# Patient Record
Sex: Female | Born: 1960 | Race: Black or African American | Hispanic: No | Marital: Single | Smoking: Current every day smoker
Health system: Southern US, Community
[De-identification: ages and names within clinical notes are randomized; demographics above are authoritative.]

## PROBLEM LIST (undated history)

## (undated) ENCOUNTER — Emergency Department: Admission: EM | Payer: Medicaid Other | Source: Home / Self Care

## (undated) DIAGNOSIS — G43909 Migraine, unspecified, not intractable, without status migrainosus: Secondary | ICD-10-CM

## (undated) DIAGNOSIS — E785 Hyperlipidemia, unspecified: Secondary | ICD-10-CM

## (undated) DIAGNOSIS — I10 Essential (primary) hypertension: Secondary | ICD-10-CM

## (undated) DIAGNOSIS — J309 Allergic rhinitis, unspecified: Secondary | ICD-10-CM

## (undated) HISTORY — DX: Migraine, unspecified, not intractable, without status migrainosus: G43.909

## (undated) HISTORY — PX: ABDOMINAL HYSTERECTOMY: SHX81

## (undated) HISTORY — DX: Allergic rhinitis, unspecified: J30.9

---

## 2006-02-07 ENCOUNTER — Ambulatory Visit: Payer: Self-pay | Admitting: Family Medicine

## 2006-03-04 ENCOUNTER — Ambulatory Visit: Payer: Self-pay | Admitting: Family Medicine

## 2006-04-08 DIAGNOSIS — G43909 Migraine, unspecified, not intractable, without status migrainosus: Secondary | ICD-10-CM | POA: Insufficient documentation

## 2006-10-24 ENCOUNTER — Ambulatory Visit: Payer: Self-pay | Admitting: Family Medicine

## 2006-12-12 ENCOUNTER — Ambulatory Visit: Payer: Self-pay | Admitting: Family Medicine

## 2006-12-19 ENCOUNTER — Ambulatory Visit: Payer: Self-pay | Admitting: Family Medicine

## 2006-12-19 LAB — CONVERTED CEMR LAB
Albumin: 4.1 g/dL (ref 3.5–5.2)
Alkaline Phosphatase: 89 units/L (ref 39–117)
Basophils Absolute: 0 10*3/uL (ref 0.0–0.1)
Basophils Relative: 0 % (ref 0–1)
Beta hcg, urine, semiquantitative: NEGATIVE
Calcium: 9 mg/dL (ref 8.4–10.5)
Cholesterol: 238 mg/dL — ABNORMAL HIGH (ref 0–200)
Eosinophils Absolute: 0 10*3/uL (ref 0.0–0.7)
Eosinophils Relative: 1 % (ref 0–5)
HCT: 42.6 % (ref 36.0–46.0)
HDL: 46 mg/dL (ref >39)
Monocytes Absolute: 0.3 10*3/uL (ref 0.2–0.7)
Potassium: 3.6 meq/L (ref 3.5–5.3)
RDW: 13.8 % (ref 11.5–14.0)
Sodium: 148 meq/L — ABNORMAL HIGH (ref 135–145)
TSH: 0.011 microintl units/mL — ABNORMAL LOW (ref 0.350–5.50)
Total CHOL/HDL Ratio: 5.2
Total Protein: 7.1 g/dL (ref 6.0–8.3)
Triglycerides: 293 mg/dL — ABNORMAL HIGH (ref <150)

## 2006-12-25 ENCOUNTER — Encounter: Payer: Self-pay | Admitting: Family Medicine

## 2006-12-25 ENCOUNTER — Telehealth (INDEPENDENT_AMBULATORY_CARE_PROVIDER_SITE_OTHER): Payer: Self-pay | Admitting: *Deleted

## 2006-12-26 ENCOUNTER — Telehealth: Payer: Self-pay | Admitting: Family Medicine

## 2006-12-26 ENCOUNTER — Encounter: Admission: RE | Admit: 2006-12-26 | Discharge: 2006-12-26 | Payer: Self-pay | Admitting: Family Medicine

## 2006-12-26 ENCOUNTER — Ambulatory Visit: Payer: Self-pay | Admitting: Family Medicine

## 2006-12-26 DIAGNOSIS — R109 Unspecified abdominal pain: Secondary | ICD-10-CM | POA: Insufficient documentation

## 2006-12-31 ENCOUNTER — Telehealth: Payer: Self-pay | Admitting: Family Medicine

## 2006-12-31 DIAGNOSIS — N9489 Other specified conditions associated with female genital organs and menstrual cycle: Secondary | ICD-10-CM | POA: Insufficient documentation

## 2007-01-05 ENCOUNTER — Ambulatory Visit: Payer: Self-pay | Admitting: Family Medicine

## 2007-01-05 DIAGNOSIS — R030 Elevated blood-pressure reading, without diagnosis of hypertension: Secondary | ICD-10-CM | POA: Insufficient documentation

## 2007-01-12 ENCOUNTER — Encounter: Payer: Self-pay | Admitting: Family Medicine

## 2007-01-14 ENCOUNTER — Telehealth (INDEPENDENT_AMBULATORY_CARE_PROVIDER_SITE_OTHER): Payer: Self-pay | Admitting: *Deleted

## 2007-01-19 ENCOUNTER — Encounter: Payer: Self-pay | Admitting: Family Medicine

## 2007-01-19 LAB — CONVERTED CEMR LAB
Alkaline Phosphatase: 80 units/L
BUN: 8 mg/dL
Calcium: 9.4 mg/dL
Creatinine, Ser: 0.9 mg/dL
Potassium: 3.4 meq/L
Sodium: 146 meq/L

## 2007-01-22 ENCOUNTER — Encounter: Payer: Self-pay | Admitting: Family Medicine

## 2007-02-19 ENCOUNTER — Encounter: Payer: Self-pay | Admitting: Family Medicine

## 2007-12-29 ENCOUNTER — Ambulatory Visit: Payer: Self-pay | Admitting: Family Medicine

## 2007-12-29 DIAGNOSIS — L255 Unspecified contact dermatitis due to plants, except food: Secondary | ICD-10-CM | POA: Insufficient documentation

## 2008-01-20 ENCOUNTER — Encounter: Payer: Self-pay | Admitting: Family Medicine

## 2008-01-20 ENCOUNTER — Ambulatory Visit: Payer: Self-pay | Admitting: Family Medicine

## 2008-01-20 ENCOUNTER — Other Ambulatory Visit: Admission: RE | Admit: 2008-01-20 | Discharge: 2008-01-20 | Payer: Self-pay | Admitting: Family Medicine

## 2008-01-20 DIAGNOSIS — N951 Menopausal and female climacteric states: Secondary | ICD-10-CM | POA: Insufficient documentation

## 2008-01-21 ENCOUNTER — Telehealth (INDEPENDENT_AMBULATORY_CARE_PROVIDER_SITE_OTHER): Payer: Self-pay | Admitting: *Deleted

## 2008-01-21 ENCOUNTER — Encounter: Payer: Self-pay | Admitting: Family Medicine

## 2008-01-21 LAB — CONVERTED CEMR LAB: Direct LDL: 93 mg/dL

## 2008-01-22 LAB — CONVERTED CEMR LAB
ALT: 13 units/L (ref 0–35)
AST: 19 units/L (ref 0–37)
BUN: 9 mg/dL (ref 6–23)
Calcium: 9 mg/dL (ref 8.4–10.5)
Creatinine, Ser: 0.82 mg/dL (ref 0.40–1.20)
Glucose, Bld: 92 mg/dL (ref 70–99)
Potassium: 3.8 meq/L (ref 3.5–5.3)
Sodium: 147 meq/L — ABNORMAL HIGH (ref 135–145)
Total CHOL/HDL Ratio: 5.8

## 2008-01-25 ENCOUNTER — Encounter: Admission: RE | Admit: 2008-01-25 | Discharge: 2008-01-25 | Payer: Self-pay | Admitting: Family Medicine

## 2008-03-30 ENCOUNTER — Ambulatory Visit: Payer: Self-pay | Admitting: Family Medicine

## 2008-04-11 ENCOUNTER — Ambulatory Visit: Payer: Self-pay | Admitting: Family Medicine

## 2008-06-15 ENCOUNTER — Ambulatory Visit: Payer: Self-pay | Admitting: Family Medicine

## 2008-06-15 LAB — CONVERTED CEMR LAB
Bilirubin Urine: NEGATIVE
Glucose, Urine, Semiquant: NEGATIVE
Ketones, urine, test strip: NEGATIVE
Nitrite: NEGATIVE
Specific Gravity, Urine: >=1.03
Urobilinogen, UA: 0.2
pH: 6

## 2008-07-14 ENCOUNTER — Encounter: Admission: RE | Admit: 2008-07-14 | Discharge: 2008-07-14 | Payer: Self-pay | Admitting: Family Medicine

## 2008-07-14 ENCOUNTER — Ambulatory Visit: Payer: Self-pay | Admitting: Family Medicine

## 2008-07-14 DIAGNOSIS — N63 Unspecified lump in unspecified breast: Secondary | ICD-10-CM | POA: Insufficient documentation

## 2008-12-16 ENCOUNTER — Ambulatory Visit: Payer: Self-pay | Admitting: Family Medicine

## 2009-01-30 ENCOUNTER — Encounter: Admission: RE | Admit: 2009-01-30 | Discharge: 2009-01-30 | Payer: Self-pay | Admitting: Family Medicine

## 2009-02-15 ENCOUNTER — Encounter: Admission: RE | Admit: 2009-02-15 | Discharge: 2009-02-15 | Payer: Self-pay | Admitting: Family Medicine

## 2009-02-15 ENCOUNTER — Ambulatory Visit: Payer: Self-pay | Admitting: Family Medicine

## 2009-02-21 ENCOUNTER — Ambulatory Visit: Payer: Self-pay | Admitting: Family Medicine

## 2009-05-03 ENCOUNTER — Ambulatory Visit: Payer: Self-pay | Admitting: Family Medicine

## 2009-05-03 DIAGNOSIS — G5600 Carpal tunnel syndrome, unspecified upper limb: Secondary | ICD-10-CM | POA: Insufficient documentation

## 2009-05-03 DIAGNOSIS — K5289 Other specified noninfective gastroenteritis and colitis: Secondary | ICD-10-CM | POA: Insufficient documentation

## 2009-05-04 LAB — CONVERTED CEMR LAB
ALT: 10 units/L (ref 0–35)
AST: 16 units/L (ref 0–37)
Alkaline Phosphatase: 120 units/L — ABNORMAL HIGH (ref 39–117)
BUN: 9 mg/dL (ref 6–23)
Eosinophils Relative: 1 % (ref 0–5)
Hemoglobin: 14.1 g/dL (ref 12.0–15.0)
Lymphs Abs: 3.1 10*3/uL (ref 0.7–4.0)
MCHC: 33 g/dL (ref 30.0–36.0)
Monocytes Relative: 4 % (ref 3–12)
Neutrophils Relative %: 55 % (ref 43–77)
Platelets: 233 10*3/uL (ref 150–400)
Potassium: 3.8 meq/L (ref 3.5–5.3)
Total Bilirubin: 0.3 mg/dL (ref 0.3–1.2)
WBC: 7.8 10*3/uL (ref 4.0–10.5)

## 2009-05-09 ENCOUNTER — Encounter: Payer: Self-pay | Admitting: Family Medicine

## 2009-05-12 ENCOUNTER — Ambulatory Visit: Payer: Self-pay | Admitting: Family Medicine

## 2009-11-09 ENCOUNTER — Ambulatory Visit: Payer: Self-pay | Admitting: Family Medicine

## 2010-01-05 ENCOUNTER — Ambulatory Visit: Payer: Self-pay | Admitting: Family Medicine

## 2010-01-05 DIAGNOSIS — B369 Superficial mycosis, unspecified: Secondary | ICD-10-CM | POA: Insufficient documentation

## 2010-01-06 ENCOUNTER — Encounter: Payer: Self-pay | Admitting: Family Medicine

## 2010-01-08 ENCOUNTER — Telehealth (INDEPENDENT_AMBULATORY_CARE_PROVIDER_SITE_OTHER): Payer: Self-pay | Admitting: *Deleted

## 2010-02-02 ENCOUNTER — Ambulatory Visit: Payer: Self-pay | Admitting: Family Medicine

## 2010-02-02 DIAGNOSIS — J069 Acute upper respiratory infection, unspecified: Secondary | ICD-10-CM | POA: Insufficient documentation

## 2010-02-13 ENCOUNTER — Encounter: Admission: RE | Admit: 2010-02-13 | Discharge: 2010-02-13 | Payer: Self-pay | Admitting: Family Medicine

## 2010-02-13 LAB — HM MAMMOGRAPHY: HM Mammogram: NEGATIVE

## 2010-04-11 ENCOUNTER — Ambulatory Visit: Payer: Self-pay | Admitting: Family Medicine

## 2010-04-20 ENCOUNTER — Ambulatory Visit: Payer: Self-pay | Admitting: Family Medicine

## 2010-05-29 ENCOUNTER — Ambulatory Visit: Payer: Self-pay | Admitting: Family Medicine

## 2010-06-12 ENCOUNTER — Telehealth: Payer: Self-pay | Admitting: Family Medicine

## 2010-07-24 ENCOUNTER — Encounter
Admission: RE | Admit: 2010-07-24 | Discharge: 2010-07-24 | Payer: Self-pay | Source: Home / Self Care | Attending: Family Medicine | Admitting: Family Medicine

## 2010-07-24 ENCOUNTER — Ambulatory Visit
Admission: RE | Admit: 2010-07-24 | Discharge: 2010-07-24 | Payer: Self-pay | Source: Home / Self Care | Attending: Family Medicine | Admitting: Family Medicine

## 2010-07-24 DIAGNOSIS — M79609 Pain in unspecified limb: Secondary | ICD-10-CM | POA: Insufficient documentation

## 2010-07-31 NOTE — Letter (Signed)
Summary: Out of Work  [facility]  [address]   [place], [place] [address]   Phone: [phone]  Fax: [phone]    April 11, 2010   Employee:  [name]    To Whom It May Concern:   For Medical reasons, please excuse the above named employee from work for the following dates:  Start:   Oct 12th -13th  End:   Oct 14th  If you need additional information, please feel free to contact our office.         Sincerely,    Seymour Bars DO

## 2010-07-31 NOTE — Assessment & Plan Note (Signed)
Summary: rash/ migraines   Vital Signs:  Patient profile:   50 year old female Height:      66.5 inches Weight:      208 pounds BMI:     33.19 O2 Sat:      99 % on Room air Pulse rate:   81 / minute BP sitting:   125 / 79  (left arm) Cuff size:   large  Vitals Entered By: Payton Spark CMA (January 05, 2010 11:15 AM)  O2 Flow:  Room air CC: Rash on both arms x 2 weeks. Also needs all meds refilled   Primary Care Provider:  Seymour Bars D.O.  CC:  Rash on both arms x 2 weeks. Also needs all meds refilled.  History of Present Illness: 50 yo AAF presents for a rash on her L arm (only ) x 2 wks.  It is not itchy or painful.  Denies any known exposures or contact with ringworm, animals, soil, etc.  O/W feeling well.  Not using anything to treat this.  Not spreading.   Denies any new meds.  Having more frequent migraines.  Using Imitrex 3 x a wk this summer.  Current Medications (verified): 1)  Zyrtec-D  Tb12 (Cetirizine-Pseudoephedrine Tb12) .Marland Kitchen.. 1 Tab By Mouth Two Times A Day Prn 2)  Flonase 50 Mcg/act Susp (Fluticasone Propionate) .... 2 Sprays Per Nostril Once Daily 3)  Imitrex 100 Mg Tabs (Sumatriptan Succinate) .Marland Kitchen.. 1 Tab By Mouth X 1 As Needed Migraine 4)  Ambien 10 Mg Tabs (Zolpidem Tartrate) .Marland Kitchen.. 1 Tab By Mouth At Bedtime As Needed Sleep 5)  Hydrochlorothiazide 12.5 Mg  Tabs (Hydrochlorothiazide) .Marland Kitchen.. 1 Tab By Mouth Daily 6)  Proair Hfa 108 (90 Base) Mcg/act Aers (Albuterol Sulfate) .... 2 Puffs 4 X A Day For 1 Wk 7)  Promethazine Hcl 25 Mg Tabs (Promethazine Hcl) .Marland Kitchen.. 1 Tab By Mouth Q 6 Hrs As Needed Nausea 8)  Zovirax 5 % Crea (Acyclovir) .... Apply To Cold Sores 5 X A Day For 4 Days  Allergies (verified): No Known Drug Allergies  Past History:  Past Medical History: Reviewed history from 10/24/2006 and no changes required. AR migraines  Social History: Reviewed history from 01/20/2008 and no changes required. 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  Sexually active w/ boyfriend.    Review of Systems      See HPI  Physical Exam  General:  alert, well-developed, well-nourished, and well-hydrated.   Mouth:  pharynx pink and moist.   Neck:  no masses.   Lungs:  Normal respiratory effort, chest expands symmetrically. Lungs are clear to auscultation, no crackles or wheezes. Heart:  Normal rate and regular rhythm. S1 and S2 normal without gallop, murmur, click, rub or other extra sounds. Skin:  Raised border annular lesion L upper arm with central clearing.  Erythematous tiny blistering lesions above and below this.  No visible insect puncture site.  No scaling. Psych:  good eye contact, not anxious appearing, and not depressed appearing.     Impression & Recommendations:  Problem # 1:  FUNGAL DERMATITIS (ICD-111.9) Obtained KOH from L arm rash.  Likely tinea corporis.  Empirically treat with Ketoconazole cream.  DDX includes cutaneous larva migrans.   Her updated medication list for this problem includes:    Ketoconazole 2 % Crea (Ketoconazole) .Marland Kitchen... Apply to rash two times a day x 2 wks  Orders: T-KOH Prep  Fungal ([id number])  Problem # 2:  MIGRAINE, UNSPEC., W/O INTRACTABLE MIGRAINE (ICD-346.90) More frequent migraines.  Add daily prophylactic agent - Atenolol once a day.  Change Imitrex to Naratriptan due to longer half life.  Call if any problems. Her updated medication list for this problem includes:    Naratriptan Hcl 2.5 Mg Tabs (Naratriptan hcl) .Marland Kitchen... 1 tab by mouth x 1; repeat in 4 hrs if needed    Atenolol 25 Mg Tabs (Atenolol) .Marland Kitchen... 1 tab by mouth daily  Complete Medication List: 1)  Zyrtec-d Tb12 (Cetirizine-pseudoephedrine tb12) .Marland Kitchen.. 1 tab by mouth two times a day prn 2)  Flonase 50 Mcg/act Susp (Fluticasone propionate) .... 2 sprays per nostril once daily 3)  Naratriptan Hcl 2.5 Mg Tabs (Naratriptan hcl) .Marland Kitchen.. 1 tab by mouth x 1; repeat in 4 hrs  if needed 4)  Ambien 10 Mg Tabs (Zolpidem tartrate) .Marland Kitchen.. 1 tab by mouth at bedtime as needed sleep 5)  Hydrochlorothiazide 12.5 Mg Tabs (Hydrochlorothiazide) .Marland Kitchen.. 1 tab by mouth daily 6)  Proair Hfa 108 (90 Base) Mcg/act Aers (Albuterol sulfate) .... 2 puffs 4 x a day  prn 7)  Promethazine Hcl 25 Mg Tabs (Promethazine hcl) .Marland Kitchen.. 1 tab by mouth q 6 hrs as needed nausea 8)  Zovirax 5 % Crea (Acyclovir) .... Apply to cold sores 5 x a day for 4 days 9)  Atenolol 25 Mg Tabs (Atenolol) .Marland Kitchen.. 1 tab by mouth daily 10)  Ketoconazole 2 % Crea (Ketoconazole) .... Apply to rash two times a day x 2 wks  Patient Instructions: 1)  Will call you with fungal prep on Monday. 2)  start on Atenolol daily for migraine prevention and change imitrex to naratriptan.   Prescriptions: KETOCONAZOLE 2 % CREA (KETOCONAZOLE) apply to rash two times a day x 2 wks  #1 tube x 0   Entered and Authorized by:   Seymour Bars DO   Signed by:   Seymour Bars DO on 01/05/2010   Method used:   Electronically to        [facility] [phone]* (retail)       [address]       [place], [place]  [address]       Ph: [phone]       Fax: [phone]   RxID:   [id number] NARATRIPTAN HCL 2.5 MG TABS (NARATRIPTAN HCL) 1 tab by mouth x 1; repeat in 4 hrs if needed  #12 x 1   Entered and Authorized by:   Seymour Bars DO   Signed by:   Seymour Bars DO on 01/05/2010   Method used:   Electronically to        [facility] [phone]* (retail)       [address]       [place], [place]  [address]       Ph: [phone]       Fax: [phone]   RxID:   [id number] PROAIR HFA 108 (90 BASE) MCG/ACT AERS (ALBUTEROL SULFATE) 2 puffs 4 x a day  prn  #1 x 1   Entered and Authorized by:   Seymour Bars DO   Signed by:   Seymour Bars DO on 01/05/2010   Method used:   Electronically to        [facility] [phone]* (retail)       [address]       [place], [place]  [address]       Ph: [phone]  Fax: [phone]   RxID:    [id number] ATENOLOL 25 MG TABS (ATENOLOL) 1 tab by mouth daily  #30 x 2   Entered and Authorized by:   Seymour Bars DO   Signed by:   Seymour Bars DO on 01/05/2010   Method used:   Electronically to        [facility] [phone]* (retail)       [address]       [place], [place]  [address]       Ph: [phone]       Fax: [phone]   RxID:   [id number]

## 2010-07-31 NOTE — Assessment & Plan Note (Signed)
Summary: FLU-SHOT  Nurse Visit  Flu Vaccine Consent Questions     Do you have a history of severe allergic reactions to this vaccine? no    Any prior history of allergic reactions to egg and/or gelatin? no    Do you have a sensitivity to the preservative Thimersol? no    Do you have a past history of Guillan-Barre Syndrome? no    Do you currently have an acute febrile illness? no    Have you ever had a severe reaction to latex? no    Vaccine information given and explained to patient? yes    Are you currently pregnant? no    Lot Number:[id number]   Exp Date:12/29/2010   Site Given  Left Deltoid IM  Allergies: No Known Drug Allergies  Orders Added: 1)  Admin 1st Vaccine [[address]] 2)  Flu Vaccine 34yrs + [[address]]

## 2010-07-31 NOTE — Assessment & Plan Note (Signed)
Summary: URI   Vital Signs:  Patient profile:   50 year old female Height:      66.5 inches Weight:      207 pounds BMI:     33.03 O2 Sat:      98 % on Room air Temp:     98.5 degrees F oral Pulse rate:   92 / minute BP sitting:   143 / 81  (left arm) Cuff size:   large  Vitals Entered By: Payton Spark CMA (February 02, 2010 1:05 PM)  O2 Flow:  Room air CC: Chest congestion and cough x 1 week.   Primary Care Provider:  Seymour Bars D.O.  CC:  Chest congestion and cough x 1 week.Marland Kitchen  History of Present Illness: 50 yo AAF presents for cough and head/ chest congestion x 5 days.  Her cough has become productive of green phlegm.  She works at a daycare.  She has had some chest tightness and SOB.  She is taking theraflu which helps some.  She coughs a lot at night.  Low grade fevers.  Feels really tired.    She has a hx of allergies but no asthma or smoking hx. Denies any GI upset.      Current Medications (verified): 1)  Zyrtec-D  Tb12 (Cetirizine-Pseudoephedrine Tb12) .Marland Kitchen.. 1 Tab By Mouth Two Times A Day Prn 2)  Flonase 50 Mcg/act Susp (Fluticasone Propionate) .... 2 Sprays Per Nostril Once Daily 3)  Naratriptan Hcl 2.5 Mg Tabs (Naratriptan Hcl) .Marland Kitchen.. 1 Tab By Mouth X 1; Repeat in 4 Hrs If Needed 4)  Ambien 10 Mg Tabs (Zolpidem Tartrate) .Marland Kitchen.. 1 Tab By Mouth At Bedtime As Needed Sleep 5)  Hydrochlorothiazide 12.5 Mg  Tabs (Hydrochlorothiazide) .Marland Kitchen.. 1 Tab By Mouth Daily 6)  Proair Hfa 108 (90 Base) Mcg/act Aers (Albuterol Sulfate) .... 2 Puffs 4 X A Day  Prn 7)  Promethazine Hcl 25 Mg Tabs (Promethazine Hcl) .Marland Kitchen.. 1 Tab By Mouth Q 6 Hrs As Needed Nausea 8)  Zovirax 5 % Crea (Acyclovir) .... Apply To Cold Sores 5 X A Day For 4 Days 9)  Atenolol 25 Mg Tabs (Atenolol) .Marland Kitchen.. 1 Tab By Mouth Daily 10)  Ketoconazole 2 % Crea (Ketoconazole) .... Apply To Rash Two Times A Day X 2 Wks  Allergies (verified): No Known Drug Allergies  Past History:  Past Medical History: Reviewed history  from 10/24/2006 and no changes required. AR migraines  Social History: Reviewed history from 01/20/2008 and no changes required. 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  Sexually active w/ boyfriend.    Review of Systems      See HPI  Physical Exam  General:  alert, well-developed, well-nourished, well-hydrated, and overweight-appearing.  alert, well-developed, well-nourished, and well-hydrated.   Head:  normocephalic and atraumatic.  sinuses NTTPnormocephalic and atraumatic.   Eyes:  conjunctiva clear Ears:  EACs patent; TMs translucent and gray with good cone of light and bony landmarks.  Nose:  clear rhinorrhea with boggy turbinates Mouth:  o/p pink and moist Neck:  no masses.  no masses.   Chest Wall:  no tenderness.  no tenderness.   Lungs:  Normal respiratory effort, chest expands symmetrically. Lungs are clear to auscultation, no crackles or wheezes. dry cough Heart:  Normal rate and regular rhythm. S1 and S2 normal without gallop, murmur, click, rub or other extra sounds. Skin:  color normal.  papular  rash on upper armscolor normal.   Cervical Nodes:  No lymphadenopathy noted   Impression & Recommendations:  Problem # 1:  VIRAL URI (ICD-465.9)  Her updated medication list for this problem includes:    Zyrtec-d Tb12 (Cetirizine-pseudoephedrine tb12) .Marland Kitchen... 1 tab by mouth two times a day prn    Promethazine Hcl 25 Mg Tabs (Promethazine hcl) .Marland Kitchen... 1 tab by mouth q 6 hrs as needed nausea    Promethazine Vc/codeine 6.25-5-10 Mg/88ml Syrp (Phenyleph-promethazine-cod) .Marland KitchenMarland KitchenMarland KitchenMarland Kitchen 5 ml by mouth q 6 hrs as needed cough/ congestion  Instructed on symptomatic treatment. Call if symptoms persist or worsen.   Complete Medication List: 1)  Zyrtec-d Tb12 (Cetirizine-pseudoephedrine tb12) .Marland Kitchen.. 1 tab by mouth two times a day prn 2)  Flonase 50 Mcg/act Susp (Fluticasone propionate) .... 2 sprays per nostril  once daily 3)  Naratriptan Hcl 2.5 Mg Tabs (Naratriptan hcl) .Marland Kitchen.. 1 tab by mouth x 1; repeat in 4 hrs if needed 4)  Ambien 10 Mg Tabs (Zolpidem tartrate) .Marland Kitchen.. 1 tab by mouth at bedtime as needed sleep 5)  Hydrochlorothiazide 12.5 Mg Tabs (Hydrochlorothiazide) .Marland Kitchen.. 1 tab by mouth daily 6)  Proair Hfa 108 (90 Base) Mcg/act Aers (Albuterol sulfate) .... 2 puffs 4 x a day  prn 7)  Promethazine Hcl 25 Mg Tabs (Promethazine hcl) .Marland Kitchen.. 1 tab by mouth q 6 hrs as needed nausea 8)  Zovirax 5 % Crea (Acyclovir) .... Apply to cold sores 5 x a day for 4 days 9)  Atenolol 25 Mg Tabs (Atenolol) .Marland Kitchen.. 1 tab by mouth daily 10)  Ketoconazole 2 % Crea (Ketoconazole) .... Apply to rash two times a day x 2 wks 11)  Promethazine Vc/codeine 6.25-5-10 Mg/39ml Syrp (Phenyleph-promethazine-cod) .... 5 ml by mouth q 6 hrs as needed cough/ congestion  Patient Instructions: 1)  Use Rx cough syrup as needed for cough and congestion. 2)  It may make you drowsy. 3)  Call if not improved by next WED. Prescriptions: PROMETHAZINE VC/CODEINE 6.25-5-10 MG/5ML SYRP (PHENYLEPH-PROMETHAZINE-COD) 5 ml by mouth q 6 hrs as needed cough/ congestion  #200 ml x 0   Entered and Authorized by:   Seymour Bars DO   Signed by:   Seymour Bars DO on 02/02/2010   Method used:   Printed then faxed to ...       [facility] [phone]* (retail)       [address]       [place], [place]  [address]       Ph: [phone]       Fax: [phone]   RxID:   [id number]

## 2010-07-31 NOTE — Assessment & Plan Note (Signed)
Summary: gastroenteritis   Vital Signs:  Patient profile:   50 year old female Height:      66.5 inches Weight:      207 pounds BMI:     33.03 O2 Sat:      98 % on Room air Temp:     97.9 degrees F oral Pulse rate:   82 / minute BP sitting:   142 / 94  (left arm) Cuff size:   large  Vitals Entered By: Payton Spark CMA (April 11, 2010 9:49 AM)  O2 Flow:  Room air CC: Nausea, achy, diarrhea, and chills x 5 days.   Primary Care Libia Fazzini:  Seymour Bars D.O.  CC:  Nausea, achy, diarrhea, and and chills x 5 days.Marland Kitchen  History of Present Illness: 50 yo AAF presents for not feeling well x 5 days.  Started with fatigue.  Having N/V/D.  She was sent home from work yesterday.  She has been taking Promethazine which helped some.  She is having 3 loose stools / day.  Having anorexia, bloating and cramping.  No blood in her stool.  No fevers or chills.  She works at [employer].  Recent travel was to [place].    Current Medications (verified): 1)  Zyrtec-D  Tb12 (Cetirizine-Pseudoephedrine Tb12) .Marland Kitchen.. 1 Tab By Mouth Two Times A Day Prn 2)  Flonase 50 Mcg/act Susp (Fluticasone Propionate) .... 2 Sprays Per Nostril Once Daily 3)  Naratriptan Hcl 2.5 Mg Tabs (Naratriptan Hcl) .Marland Kitchen.. 1 Tab By Mouth X 1; Repeat in 4 Hrs If Needed 4)  Ambien 10 Mg Tabs (Zolpidem Tartrate) .Marland Kitchen.. 1 Tab By Mouth At Bedtime As Needed Sleep 5)  Hydrochlorothiazide 12.5 Mg  Tabs (Hydrochlorothiazide) .Marland Kitchen.. 1 Tab By Mouth Daily 6)  Proair Hfa 108 (90 Base) Mcg/act Aers (Albuterol Sulfate) .... 2 Puffs 4 X A Day  Prn 7)  Promethazine Hcl 25 Mg Tabs (Promethazine Hcl) .Marland Kitchen.. 1 Tab By Mouth Q 6 Hrs As Needed Nausea 8)  Zovirax 5 % Crea (Acyclovir) .... Apply To Cold Sores 5 X A Day For 4 Days 9)  Atenolol 25 Mg Tabs (Atenolol) .Marland Kitchen.. 1 Tab By Mouth Daily 10)  Ketoconazole 2 % Crea (Ketoconazole) .... Apply To Rash Two Times A Day X 2 Wks  Allergies (verified): No Known Drug Allergies  Past History:  Past Medical  History: Reviewed history from 10/24/2006 and no changes required. AR migraines  Past Surgical History: Reviewed history from 01/20/2008 and no changes required. 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  Sexually active w/ boyfriend.    Review of Systems      See HPI  Physical Exam  General:  alert, well-developed, well-nourished, well-hydrated, and overweight-appearing.   Head:  normocephalic and atraumatic.   Eyes:  sclera non icteric Mouth:  pharynx pink and moist.   Neck:  no masses.   Lungs:  Normal respiratory effort, chest expands symmetrically. Lungs are clear to auscultation, no crackles or wheezes. Heart:  Normal rate and regular rhythm. S1 and S2 normal without gallop, murmur, click, rub or other extra sounds. Abdomen:  R and L LQ TTP but no R/G/R, mildly distended Extremities:  no LE edema Skin:  color normal.  no jaundice or pallor Cervical Nodes:  No lymphadenopathy noted   Impression & Recommendations:  Problem #  1:  GASTROENTERITIS WITHOUT DEHYDRATION (ICD-558.9) Day 5 viral gastroenteritis, improving.  No sign of clinical dehydration. Treat with Immodium and Zofran.  Clear fluids with advancement to [employer] tomorrow.  Out of work note given for today and tomorrow. Call if not completely resolved by Monday. Her updated medication list for this problem includes:    Zofran Odt 8 Mg Tbdp (Ondansetron) .Marland Kitchen... 1 tab by mouth q 8 hrs as needed nausea  Complete Medication List: 1)  Zyrtec-d Tb12 (Cetirizine-pseudoephedrine tb12) .Marland Kitchen.. 1 tab by mouth two times a day prn 2)  Flonase 50 Mcg/act Susp (Fluticasone propionate) .... 2 sprays per nostril once daily 3)  Naratriptan Hcl 2.5 Mg Tabs (Naratriptan hcl) .Marland Kitchen.. 1 tab by mouth x 1; repeat in 4 hrs if  needed 4)  Ambien 10 Mg Tabs (Zolpidem tartrate) .Marland Kitchen.. 1 tab by mouth at bedtime as needed sleep 5)  Hydrochlorothiazide 12.5 Mg Tabs (Hydrochlorothiazide) .Marland Kitchen.. 1 tab by mouth daily 6)  Proair Hfa 108 (90 Base) Mcg/act Aers (Albuterol sulfate) .... 2 puffs 4 x a day  prn 7)  Promethazine Hcl 25 Mg Tabs (Promethazine hcl) .Marland Kitchen.. 1 tab by mouth q 6 hrs as needed nausea 8)  Zovirax 5 % Crea (Acyclovir) .... Apply to cold sores 5 x a day for 4 days 9)  Atenolol 25 Mg Tabs (Atenolol) .Marland Kitchen.. 1 tab by mouth daily 10)  Zofran Odt 8 Mg Tbdp (Ondansetron) .Marland Kitchen.. 1 tab by mouth q 8 hrs as needed nausea  Patient Instructions: 1)  Use Immodium + Zofran as needed for diarrhea and nausea. 2)  Clear fluids today with advancement to Bananas, Rice, Applesause and Toast tomorrow. 3)  Expect improvement in the next 2-3 days. 4)  Call if not completely resolved by MON. Prescriptions: ZOFRAN ODT 8 MG TBDP (ONDANSETRON) 1 tab by mouth q 8 hrs as needed nausea  #15 x 0   Entered and Authorized by:   Seymour Bars DO   Signed by:   Seymour Bars DO on 04/11/2010   Method used:   Electronically to        [facility] [phone]* (retail)       [address]       [place], [place]  [address]       Ph: [phone]       Fax: [phone]   RxID:   [id number]

## 2010-07-31 NOTE — Progress Notes (Signed)
Summary: rash  Phone Note Call from Patient Call back at Home Phone [phone]   Caller: Patient Call For: Seymour Bars DO Details for Reason: Rash Summary of Call: Patient called stating the rash is improved but still has bumps.  She is continuing the ointment and wants to know what Dr Cathey Endow advises at this time. Initial call taken by: Fabienne Bruns,  January 08, 2010 8:26 AM  Follow-up for Phone Call        Continue the Ketoconazole cream thru this wk.  Call if symptoms persist into next wk.   Follow-up by: Seymour Bars DO,  January 08, 2010 10:34 AM  Additional Follow-up for Phone Call Additional follow up Details #1::        [facility] informing Pt of the above Additional Follow-up by: Payton Spark CMA,  January 08, 2010 11:44 AM

## 2010-07-31 NOTE — Assessment & Plan Note (Signed)
Summary: URI   Vital Signs:  Patient profile:   50 year old female Height:      66.5 inches Weight:      206 pounds BMI:     32.87 O2 Sat:      97 % on Room air Temp:     98.5 degrees F oral Pulse rate:   91 / minute BP sitting:   139 / 89  (left arm) Cuff size:   large  Vitals Entered By: Payton Spark CMA (May 29, 2010 8:38 AM)  O2 Flow:  Room air CC: Productive cough and ear pressure x 3 days. OTC meds not helping.    Primary Care Provider:  Seymour Bars D.O.  CC:  Productive cough and ear pressure x 3 days. OTC meds not helping. Wanda Ortiz  History of Present Illness: 50 yo AAF presents for a cough that started 5 days ago.  She tried mucinex and Robitussin but it has not helped.  Denies chest tightness or SOB.  She is a smoker.  Denies F/C.  Some green phlegm.  She is tired but has not had body aches or GI symptoms.  She has had ear pressure, sore throat and runny nose but this is getting better.  Current Medications (verified): 1)  Zyrtec-D  Tb12 (Cetirizine-Pseudoephedrine Tb12) .Wanda Ortiz.. 1 Tab By Mouth Two Times A Day Prn 2)  Flonase 50 Mcg/act Susp (Fluticasone Propionate) .... 2 Sprays Per Nostril Once Daily 3)  Naratriptan Hcl 2.5 Mg Tabs (Naratriptan Hcl) .Wanda Ortiz.. 1 Tab By Mouth X 1; Repeat in 4 Hrs If Needed 4)  Ambien 10 Mg Tabs (Zolpidem Tartrate) .Wanda Ortiz.. 1 Tab By Mouth At Bedtime As Needed Sleep 5)  Hydrochlorothiazide 12.5 Mg  Tabs (Hydrochlorothiazide) .Wanda Ortiz.. 1 Tab By Mouth Daily 6)  Proair Hfa 108 (90 Base) Mcg/act Aers (Albuterol Sulfate) .... 2 Puffs 4 X A Day  Prn 7)  Zovirax 5 % Crea (Acyclovir) .... Apply To Cold Sores 5 X A Day For 4 Days 8)  Atenolol 25 Mg Tabs (Atenolol) .Wanda Ortiz.. 1 Tab By Mouth Daily 9)  Zofran Odt 8 Mg Tbdp (Ondansetron) .Wanda Ortiz.. 1 Tab By Mouth Q 8 Hrs As Needed Nausea  Allergies (verified): No Known Drug Allergies  Past History:  Past Medical History: Reviewed history from 10/24/2006 and no changes required. AR migraines  Social History: Reviewed  history from 01/20/2008 and no changes required. 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  Sexually active w/ boyfriend.    Review of Systems      See HPI  Physical Exam  General:  alert, well-developed, well-nourished, and well-hydrated.  obese Head:  normocephalic and atraumatic.  sinuses NTTP Eyes:  conjunctiva clear Ears:  EACs patent; TMs translucent and gray with good cone of light and bony landmarks.  Nose:  boggy turbinates L>R with nasal congestion; no rhinorrhea Mouth:  o/p injected but no exudates or vesicles Neck:  no masses.   Chest Wall:  no tenderness.   Lungs:  Normal respiratory effort, chest expands symmetrically. Lungs are clear to auscultation, no crackles or wheezes.  dry cough Heart:  Normal rate and regular rhythm. S1 and S2 normal without gallop, murmur, click, rub or other extra sounds. Extremities:  no LE edema Skin:  color normal.   Cervical Nodes:  No lymphadenopathy noted   Impression & Recommendations:  Problem # 1:  VIRAL URI (ICD-465.9) Supportive care for Viral URI.  RX  REzira (hydrocodone - sudafed) samples given (#8- 5ml bottles) to take at night with Mucinex DM + Advil in the AM.  Avoid smoking and call if not resolved in 7 days. The following medications were removed from the medication list:    Promethazine Hcl 25 Mg Tabs (Promethazine hcl) .Wanda Ortiz... 1 tab by mouth q 6 hrs as needed nausea Her updated medication list for this problem includes:    Zyrtec-d Tb12 (Cetirizine-pseudoephedrine tb12) .Wanda Ortiz... 1 tab by mouth two times a day prn  Complete Medication List: 1)  Zyrtec-d Tb12 (Cetirizine-pseudoephedrine tb12) .Wanda Ortiz.. 1 tab by mouth two times a day prn 2)  Flonase 50 Mcg/act Susp (Fluticasone propionate) .... 2 sprays per nostril once daily 3)  Naratriptan Hcl 2.5 Mg Tabs (Naratriptan hcl) .Wanda Ortiz.. 1 tab by mouth x 1; repeat in 4 hrs if needed 4)  Ambien 10 Mg Tabs  (Zolpidem tartrate) .Wanda Ortiz.. 1 tab by mouth at bedtime as needed sleep 5)  Hydrochlorothiazide 12.5 Mg Tabs (Hydrochlorothiazide) .Wanda Ortiz.. 1 tab by mouth daily 6)  Proair Hfa 108 (90 Base) Mcg/act Aers (Albuterol sulfate) .... 2 puffs 4 x a day  prn 7)  Zovirax 5 % Crea (Acyclovir) .... Apply to cold sores 5 x a day for 4 days 8)  Atenolol 25 Mg Tabs (Atenolol) .Wanda Ortiz.. 1 tab by mouth daily 9)  Zofran Odt 8 Mg Tbdp (Ondansetron) .Wanda Ortiz.. 1 tab by mouth q 8 hrs as needed nausea  Patient Instructions: 1)  Use REZIRA 5 ml by mouth every 6 hrs as needed for cough.  This will cause SEDATION -- you may just want to take it at bedtime. 2)  For work days, you can take Mucinex DM + Advil in the morning and Rezira at night. 3)  Avoid smoking. 4)  Call if not improved after 7 days. Prescriptions: AMBIEN 10 MG TABS (ZOLPIDEM TARTRATE) 1 tab by mouth at bedtime as needed sleep  #30 x 2   Entered by:   Payton Spark CMA   Authorized by:   Seymour Bars DO   Signed by:   Payton Spark CMA on 05/29/2010   Method used:   Printed then faxed to ...       [facility] [phone]* (retail)       [address]       [place], [place]  [address]       Ph: [phone]       Fax: [phone]   RxID:   [id number] ATENOLOL 25 MG TABS (ATENOLOL) 1 tab by mouth daily  #30 x 2   Entered by:   Payton Spark CMA   Authorized by:   Seymour Bars DO   Signed by:   Payton Spark CMA on 05/29/2010   Method used:   Electronically to        [facility] [phone]* (retail)       [address]       [place], [place]  [address]       Ph: [phone]       Fax: [phone]   RxID:   [id number] ZOVIRAX 5 % CREA (ACYCLOVIR) apply to cold sores 5 x a day for 4 days  #1 tube x 1   Entered by:   Payton Spark CMA   Authorized by:   Seymour Bars DO   Signed by:   Payton Spark CMA on 05/29/2010   Method used:   Electronically to        [facility] [address] [phone]* (  retail)       [address]       [place], [place]  [address]       Ph:  [phone]       Fax: [phone]   RxID:   [id number] PROAIR HFA 108 (90 BASE) MCG/ACT AERS (ALBUTEROL SULFATE) 2 puffs 4 x a day  prn  #1 x 1   Entered by:   Payton Spark CMA   Authorized by:   Seymour Bars DO   Signed by:   Payton Spark CMA on 05/29/2010   Method used:   Electronically to        [facility] [phone]* (retail)       [address]       [place], [place]  [address]       Ph: [phone]       Fax: [phone]   RxID:   [id number] HYDROCHLOROTHIAZIDE 12.5 MG  TABS (HYDROCHLOROTHIAZIDE) 1 tab by mouth daily  #30 x 2   Entered by:   Payton Spark CMA   Authorized by:   Seymour Bars DO   Signed by:   Payton Spark CMA on 05/29/2010   Method used:   Electronically to        [facility] [phone]* (retail)       [address]       [place], [place]  [address]       Ph: [phone]       Fax: [phone]   RxID:   [id number] NARATRIPTAN HCL 2.5 MG TABS (NARATRIPTAN HCL) 1 tab by mouth x 1; repeat in 4 hrs if needed  #12 x 1   Entered by:   Payton Spark CMA   Authorized by:   Seymour Bars DO   Signed by:   Payton Spark CMA on 05/29/2010   Method used:   Electronically to        [facility] [phone]* (retail)       [address]       [place], [place]  [address]       Ph: [phone]       Fax: [phone]   RxID:   [id number]    Orders Added: 1)  Est. Patient Level III [97353]

## 2010-08-02 NOTE — Assessment & Plan Note (Signed)
Summary: toe pain   Vital Signs:  Patient profile:   50 year old female Height:      66.5 inches Weight:      200 pounds BMI:     31.91 O2 Sat:      98 % on Room air Pulse rate:   82 / minute BP sitting:   129 / 83  (right arm) Cuff size:   large  Vitals Entered By: Payton Spark CMA (July 24, 2010 9:49 AM)  O2 Flow:  Room air CC: Hit L toes on suitcase x 2 weeks ago- L 4th toe still swollen and painful.    Primary Care Provider:  Seymour Bars D.O.  CC:  Hit L toes on suitcase x 2 weeks ago- L 4th toe still swollen and painful. Marland Kitchen  History of Present Illness: 50 yo AAF presents for a sore L 4th toe x 2 wks after running into a suitcase.  She has had some localized swelling and bruising.  She has been elevating it, taking Tylenol and soaking it.  She is limping.  She is having nocturnal pain.  No previous problems.     Allergies: No Known Drug Allergies  Social History: Reviewed history from 01/20/2008 and no changes required. 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  Sexually active w/ boyfriend.    Review of Systems      See HPI  Physical Exam  General:  alert, well-developed, well-nourished, and well-hydrated.   Msk:  tender at the MTP L 4th digit with localized distal phalanx edema.  No bruising or redness. able to flex/ extend toe Pulses:  2+ pedal pulses Extremities:  no LE edema Neurologic:  Antalgic gait no subjective numbness Skin:  color normal.     Impression & Recommendations:  Problem # 1:  TOE PAIN (ICD-729.5) Injury 2 wks ago with continued pain/ swelling, likley a toe fx.  Xray today. If distal, tx will include buddy taping and keeping pressure off the toe for 3-4 wks.  Orders: T-DG Toe 4th*L* (81191) T-DG Foot 2 Views*L* (47829)  Complete Medication List: 1)  Zyrtec-d Tb12 (Cetirizine-pseudoephedrine tb12) .Marland Kitchen.. 1 tab by mouth two times a day prn 2)   Flonase 50 Mcg/act Susp (Fluticasone propionate) .... 2 sprays per nostril once daily 3)  Naratriptan Hcl 2.5 Mg Tabs (Naratriptan hcl) .Marland Kitchen.. 1 tab by mouth x 1; repeat in 4 hrs if needed 4)  Ambien 10 Mg Tabs (Zolpidem tartrate) .Marland Kitchen.. 1 tab by mouth at bedtime as needed sleep 5)  Hydrochlorothiazide 12.5 Mg Tabs (Hydrochlorothiazide) .Marland Kitchen.. 1 tab by mouth daily 6)  Proair Hfa 108 (90 Base) Mcg/act Aers (Albuterol sulfate) .... 2 puffs 4 x a day  prn 7)  Atenolol 25 Mg Tabs (Atenolol) .Marland Kitchen.. 1 tab by mouth daily 8)  Zofran Odt 8 Mg Tbdp (Ondansetron) .Marland Kitchen.. 1 tab by mouth q 8 hrs as needed nausea 9)  Acyclovir 200 Mg Caps (Acyclovir) .Marland Kitchen.. 1 capsule by mouth 5 x a day x 5 days as needed cold sores  Patient Instructions: 1)  Xray L foot/ 4th toe today. 2)  Will call you w/ results this afternoon. 3)  If toe is fractured, treatment includes buddy taping to the toe next to it for 2-3 wks, Tylenol as needed for pain and crutches if needed.   Orders Added: 1)  T-DG Toe 4th*L* [73660] 2)  T-DG Foot 2 Views*L* [[id number]] 3)  Est. Patient Level III [id number]

## 2010-08-02 NOTE — Progress Notes (Signed)
Summary: Zovirax too expensive  Phone Note Call from Patient   Caller: Patient Summary of Call: Pt states Zovirax is too expensive and would like something cheaper sent to pharm. Please advise. Initial call taken by: Payton Spark CMA,  June 12, 2010 11:50 AM  Follow-up for Phone Call        oral antivals will be cheaper OR she can use OTC ABREVA cream. Follow-up by: Seymour Bars DO,  June 12, 2010 12:00 PM     Appended Document: Zovirax too expensive Pt states she would like to try an oral antiviral. Please send to pharm.  Appended Document: Zovirax too expensive Acyclovir sent to pharmacy.  Seymour Bars, D.O.

## 2010-10-08 ENCOUNTER — Telehealth: Payer: Self-pay | Admitting: Family Medicine

## 2010-10-08 NOTE — Telephone Encounter (Signed)
Pt is requesting cough syrup, is having trouble sleeping at night

## 2010-10-09 MED ORDER — HYDROCODONE-HOMATROPINE 5-1.5 MG/5ML PO SYRP: 5.0000 mL | ORAL_SOLUTION | Freq: Three times a day (TID) | ORAL | Status: AC | PRN

## 2010-10-09 NOTE — Telephone Encounter (Signed)
Addended by: Nani Gasser on: 10/09/2010 08:28 AM   Modules accepted: Orders

## 2010-10-09 NOTE — Telephone Encounter (Signed)
LMOM

## 2010-10-09 NOTE — Telephone Encounter (Signed)
Pt called stating she has used promethazine w/ codeine in the past for cough due to allergies. Pt unable to be seen until Monday due to full schedule and would like to have Rx sent to pharm. Please advise.

## 2010-10-09 NOTE — Telephone Encounter (Signed)
Called in syrup with hydrocodone in it for cough. I couldn't find combo with promethazine and codeine.

## 2010-10-12 ENCOUNTER — Encounter: Payer: Self-pay | Admitting: Family Medicine

## 2010-10-16 ENCOUNTER — Ambulatory Visit: Payer: Self-pay | Admitting: Family Medicine

## 2010-11-08 ENCOUNTER — Inpatient Hospital Stay (INDEPENDENT_AMBULATORY_CARE_PROVIDER_SITE_OTHER)
Admission: RE | Admit: 2010-11-08 | Discharge: 2010-11-08 | Disposition: A | Discharge status: Home / Self Care | Payer: 59 | Source: Ambulatory Visit | Attending: Family Medicine | Admitting: Family Medicine

## 2010-11-08 ENCOUNTER — Encounter: Payer: Self-pay | Admitting: Family Medicine

## 2010-11-08 DIAGNOSIS — S0510XA Contusion of eyeball and orbital tissues, unspecified eye, initial encounter: Secondary | ICD-10-CM

## 2010-11-08 DIAGNOSIS — H1030 Unspecified acute conjunctivitis, unspecified eye: Secondary | ICD-10-CM

## 2010-11-09 ENCOUNTER — Ambulatory Visit: Payer: Self-pay | Admitting: Family Medicine

## 2010-11-09 ENCOUNTER — Telehealth (INDEPENDENT_AMBULATORY_CARE_PROVIDER_SITE_OTHER): Payer: Self-pay | Admitting: Emergency Medicine

## 2011-01-16 ENCOUNTER — Other Ambulatory Visit: Payer: Self-pay | Admitting: Family Medicine

## 2011-01-16 DIAGNOSIS — Z1231 Encounter for screening mammogram for malignant neoplasm of breast: Secondary | ICD-10-CM

## 2011-01-18 ENCOUNTER — Ambulatory Visit (INDEPENDENT_AMBULATORY_CARE_PROVIDER_SITE_OTHER): Payer: 59 | Admitting: Family Medicine

## 2011-01-18 ENCOUNTER — Encounter: Payer: Self-pay | Admitting: Family Medicine

## 2011-01-18 ENCOUNTER — Telehealth: Payer: Self-pay | Admitting: Family Medicine

## 2011-01-18 ENCOUNTER — Ambulatory Visit: Payer: 59 | Admitting: Family Medicine

## 2011-01-18 VITALS — BP 142/94 | HR 94 | Temp 98.3°F

## 2011-01-18 DIAGNOSIS — R300 Dysuria: Secondary | ICD-10-CM

## 2011-01-18 DIAGNOSIS — N390 Urinary tract infection, site not specified: Secondary | ICD-10-CM

## 2011-01-18 LAB — POCT URINALYSIS DIPSTICK
Glucose, UA: NEGATIVE
Nitrite, UA: POSITIVE
Protein, UA: NEGATIVE
Spec Grav, UA: >=1.03
Urobilinogen, UA: 0.2
pH, UA: 6

## 2011-01-18 MED ORDER — SULFAMETHOXAZOLE-TMP DS 800-160 MG PO TABS: 1.0000 | ORAL_TABLET | Freq: Two times a day (BID) | ORAL | Status: AC

## 2011-01-18 NOTE — Telephone Encounter (Signed)
pls let pt know that her UA was + for infection.  Will start her on Bactrim DS 1 tab 2 x a day for 3 days.  Drink lots of water and abstain from sex during treatment.  Call if symptoms have not resolved in 3 days or any problems with medicine.

## 2011-01-18 NOTE — Telephone Encounter (Signed)
LMOM informing Pt of the above 

## 2011-01-18 NOTE — Progress Notes (Signed)
  Subjective:    Patient ID: Wanda Ortiz, female    DOB: 10-11-1960, 50 y.o.   MRN: [id number]  HPI  Dysuria x 2 days.   Review of Systems     Objective:   Physical Exam        Assessment & Plan:  UTI-  UA is + for infection.  Will treat with Bactrim DS 2 x a day for 3 days.  Call if any problems or symptoms have not cleared by early next wk.

## 2011-02-19 ENCOUNTER — Ambulatory Visit
Admission: RE | Admit: 2011-02-19 | Discharge: 2011-02-19 | Disposition: A | Discharge status: Home / Self Care | Payer: 59 | Source: Ambulatory Visit | Attending: Family Medicine | Admitting: Family Medicine

## 2011-02-19 DIAGNOSIS — Z1231 Encounter for screening mammogram for malignant neoplasm of breast: Secondary | ICD-10-CM

## 2011-04-26 ENCOUNTER — Encounter: Payer: Self-pay | Admitting: Family Medicine

## 2011-04-26 ENCOUNTER — Ambulatory Visit (INDEPENDENT_AMBULATORY_CARE_PROVIDER_SITE_OTHER): Payer: 59 | Admitting: Family Medicine

## 2011-04-26 VITALS — BP 149/94 | HR 70 | Wt 207.0 lb

## 2011-04-26 DIAGNOSIS — Z23 Encounter for immunization: Secondary | ICD-10-CM

## 2011-04-26 DIAGNOSIS — Z1211 Encounter for screening for malignant neoplasm of colon: Secondary | ICD-10-CM

## 2011-04-26 DIAGNOSIS — J069 Acute upper respiratory infection, unspecified: Secondary | ICD-10-CM

## 2011-04-26 DIAGNOSIS — J309 Allergic rhinitis, unspecified: Secondary | ICD-10-CM

## 2011-04-26 MED ORDER — FLUTICASONE PROPIONATE 50 MCG/ACT NA SUSP: 2.0000 | Freq: Every day | NASAL | Status: DC

## 2011-04-26 MED ORDER — HYDROCHLOROTHIAZIDE 12.5 MG PO CAPS: 12.5000 mg | ORAL_CAPSULE | Freq: Every day | ORAL | Status: DC

## 2011-04-26 MED ORDER — HYDROCODONE-HOMATROPINE 5-1.5 MG/5ML PO SYRP: 5.0000 mL | ORAL_SOLUTION | Freq: Every evening | ORAL | Status: AC | PRN

## 2011-04-26 NOTE — Progress Notes (Signed)
  Subjective:    Patient ID: Wanda Ortiz, female    DOB: Feb 22, 1961, 50 y.o.   MRN: [id number]  HPI  Cough started yestreday. Runny nose. Left ear is itchy and sore. No appetite. No fever. Cough is worse at night.  Cough is dry.  More wet during the daytime. No GI sxs.  Taking some allergy medicine.  She says it is still draining and is not sure exactly what time period. No shortness of breath.  Review of Systems     Objective:   Physical Exam  Constitutional: She is oriented to person, place, and time. She appears well-developed and well-nourished.  HENT:  Head: Normocephalic and atraumatic.  Right Ear: External ear normal.  Left Ear: External ear normal.  Nose: Nose normal.  Mouth/Throat: Oropharynx is clear and moist.       TMs and canals are clear.   Eyes: Conjunctivae and EOM are normal. Pupils are equal, round, and reactive to light.  Neck: Neck supple. No thyromegaly present.  Cardiovascular: Normal rate, regular rhythm and normal heart sounds.   Pulmonary/Chest: Effort normal and breath sounds normal. She has no wheezes.  Lymphadenopathy:    She has no cervical adenopathy.  Neurological: She is alert and oriented to person, place, and time.  Skin: Skin is warm and dry.  Psychiatric: She has a normal mood and affect.          Assessment & Plan:  URI-likely viral. I recommend symptomatic care. She was given a cough medication to use at bedtime. She continued her the counter allergy medication as I do suspect with her itchy ears symptoms that she does have a component of allergic rhinitis as well. Call if not better in 7-10 days.  Also discussed importance of colon cancer screening. She is okay with referral for colonoscopy.

## 2011-04-26 NOTE — Patient Instructions (Signed)
Recheck blood pressure check with the nurse in 2 weeks Call if you Cold is getting worse of not better in one week.

## 2011-04-29 ENCOUNTER — Encounter: Payer: Self-pay | Admitting: Family Medicine

## 2011-05-30 ENCOUNTER — Emergency Department
Admission: EM | Admit: 2011-05-30 | Discharge: 2011-05-30 | Disposition: A | Discharge status: Home / Self Care | Payer: 59 | Source: Home / Self Care | Attending: Family Medicine | Admitting: Family Medicine

## 2011-05-30 ENCOUNTER — Encounter: Payer: Self-pay | Admitting: *Deleted

## 2011-05-30 DIAGNOSIS — J069 Acute upper respiratory infection, unspecified: Secondary | ICD-10-CM

## 2011-05-30 HISTORY — DX: Hyperlipidemia, unspecified: E78.5

## 2011-05-30 HISTORY — DX: Essential (primary) hypertension: I10

## 2011-05-30 LAB — POCT CBC W AUTO DIFF (K'VILLE URGENT CARE)

## 2011-05-30 MED ORDER — BENZONATATE 200 MG PO CAPS: 200.0000 mg | ORAL_CAPSULE | Freq: Every day | ORAL | Status: AC

## 2011-05-30 MED ORDER — AMOXICILLIN 875 MG PO TABS: 875.0000 mg | ORAL_TABLET | Freq: Two times a day (BID) | ORAL | Status: AC

## 2011-05-30 NOTE — ED Provider Notes (Signed)
History     CSN: [id number] Arrival date & time: 05/30/2011  8:15 AM   First MD Initiated Contact with Patient 05/30/11 [phone]      Chief Complaint  Patient presents with  . Fever  . Nausea      HPI Comments: Patient complains of approximately 4 day history of gradually progressive URI symptoms beginning with nasal congestion, but no sore throat.  A cough started about 2 days ago.  Complains of fatigue and initial myalgias.  Cough is now worse at night and generally non-productive during the day.  There has been no pleuritic pain, shortness of breath, or wheezes.   She also complains of nausea and rare vomiting.  She has had some vague lower abdominal discomfort but no GU symptoms. She has had a flu shot  Patient is a 50 y.o. female presenting with fever. The history is provided by the patient.  Fever Primary symptoms of the febrile illness include fever, fatigue, headaches, cough, nausea, vomiting and myalgias. Primary symptoms do not include visual change, wheezing, shortness of breath, abdominal pain, diarrhea, dysuria or rash. The current episode started 3 to 5 days ago. This is a new problem. The problem has been gradually worsening.  The headache is not associated with visual change.     Past Medical History  Diagnosis Date  . AR (allergic rhinitis)   . Migraines   . Hypertension   . Hyperlipemia     Past Surgical History  Procedure Date  . Abdominal hysterectomy     partial with BSO for Brenners Tumor    Family History  Problem Relation Age of Onset  . Diabetes Mother   . Hyperlipidemia Mother   . Hypertension Father   . Cancer Father     prostate    History  Substance Use Topics  . Smoking status: Current Some Day Smoker  . Smokeless tobacco: Not on file  . Alcohol Use: 1.0 oz/week    2 drink(s) per week    OB History    Grav Para Term Preterm Abortions TAB SAB Ect Mult Living                  Review of Systems  Constitutional: Positive for fever  and fatigue.  HENT: Positive for congestion. Negative for sore throat and rhinorrhea.   Eyes: Negative.   Respiratory: Positive for cough. Negative for shortness of breath and wheezing.   Cardiovascular: Negative.   Gastrointestinal: Positive for nausea and vomiting. Negative for abdominal pain and diarrhea.  Genitourinary: Negative.  Negative for dysuria.  Musculoskeletal: Positive for myalgias.  Skin: Negative.  Negative for rash.  Neurological: Positive for headaches.    Allergies  Review of patient's allergies indicates no known allergies.  Home Medications   Current Outpatient Rx  Name Route Sig Dispense Refill  . ACYCLOVIR 200 MG PO CAPS Oral Take by mouth 5 (five) times daily. X 5 prn for cold sores     . AMOXICILLIN 875 MG PO TABS Oral Take 1 tablet (875 mg total) by mouth 2 (two) times daily. (Rx void after 06/07/11) 14 tablet 0  . BENZONATATE 200 MG PO CAPS Oral Take 1 capsule (200 mg total) by mouth at bedtime. Take as needed for cough 12 capsule 0  . CETIRIZINE-PSEUDOEPHEDRINE ER 5-120 MG PO TB12 Oral Take 1 tablet by mouth 2 (two) times daily.      Marland Kitchen FLUTICASONE PROPIONATE 50 MCG/ACT NA SUSP Nasal Place 2 sprays into the nose daily. 16 g  3  . HYDROCHLOROTHIAZIDE 12.5 MG PO CAPS Oral Take 1 capsule (12.5 mg total) by mouth daily. 90 capsule 1  . NARATRIPTAN HCL 2.5 MG PO TABS Oral Take 2.5 mg by mouth as needed. Take one (1) tablet at onset of headache; if returns or does not resolve, may repeat after 4 hours; do not exceed five (5) mg in 24 hours.     . ONDANSETRON HCL 8 MG PO TABS Oral Take 8 mg by mouth every 8 (eight) hours as needed. For nausea     . ZOLPIDEM TARTRATE 10 MG PO TABS Oral Take 10 mg by mouth at bedtime as needed. For sleep       BP 148/90  Pulse 72  Temp(Src) 98.3 F (36.8 C) (Oral)  Resp 14  Ht 5' 6.5" (1.689 m)  Wt 207 lb (93.895 kg)  BMI 32.91 kg/m2  SpO2 99%  Physical Exam  Nursing note and vitals reviewed. Constitutional: She is oriented  to person, place, and time. She appears well-developed and well-nourished. No distress.  HENT:  Head: Normocephalic.  Right Ear: External ear normal.  Left Ear: External ear normal.  Nose: Mucosal edema and rhinorrhea present. No sinus tenderness.  Mouth/Throat: Oropharynx is clear and moist. No oropharyngeal exudate.  Eyes: Conjunctivae and EOM are normal. Pupils are equal, round, and reactive to light. Right eye exhibits no discharge. Left eye exhibits no discharge.  Neck: Normal range of motion. Neck supple.  Cardiovascular: Normal rate, regular rhythm and normal heart sounds.   Pulmonary/Chest: Effort normal and breath sounds normal. She exhibits no tenderness.  Abdominal: Soft. Bowel sounds are normal. There is no tenderness.  Musculoskeletal: She exhibits no edema.  Lymphadenopathy:    She has cervical adenopathy.       Right cervical: Posterior cervical adenopathy present.       Left cervical: Posterior cervical adenopathy present.  Neurological: She is alert and oriented to person, place, and time.  Skin: Skin is warm and dry. She is not diaphoretic.     ED Course  Procedures none   Labs Reviewed  POCT CBC W AUTO DIFF ([facility]):  CBC:  WBC 5.6; LY 40.0; MO 5.1; GR 5.1; Hgb 14.1       1. Acute upper respiratory infections of unspecified site       MDM  There is no evidence of bacterial infection today.   Treat symptomatically for now:  Increase fluid intake, begin expectorant, topical decongestant, saline nasal spray/saline irrigation, cough suppressant at bedtime. If fever/chills persist, or if not improving 5 days begin amoxicillin (given Rx to hold).  Followup with PCP if not improving 7 to 10 days.         Donna Christen, MD 05/31/11 [phone]

## 2011-05-30 NOTE — ED Notes (Signed)
Patient c/o fever, HA, n/v x 3 days. Taken Tylenol OTC. Decreased appetitie. She has been pushing fluids.

## 2011-05-31 ENCOUNTER — Ambulatory Visit: Payer: 59 | Admitting: Family Medicine

## 2011-05-31 DIAGNOSIS — Z0289 Encounter for other administrative examinations: Secondary | ICD-10-CM

## 2011-06-03 NOTE — Telephone Encounter (Signed)
  Phone Note Outgoing Call Call back at [facility] Phone [phone] [facility]     Call placed by: Emilio Math,  Nov 09, 2010 3:25 PM Call placed to: Patient Summary of Call: Left msg hope her eye is getting better.

## 2011-06-03 NOTE — Progress Notes (Signed)
Summary: INJURY TO LEFT EYE Room 3   Vital Signs:  Patient Profile:   50 Years Old Female CC:      Left eye pain from getting poked by a finger x 4 days ago Height:     66.5 inches Weight:      207 pounds O2 Sat:      100 % O2 treatment:    Room Air Temp:     98.3 degrees F oral Pulse rate:   74 / minute Pulse rhythm:   regular Resp:     16 per minute BP sitting:   146 / 83  (left arm) Cuff size:   large  Vitals Entered By: Emilio Math (Nov 08, 2010 8:58 AM)              Vision Screening: Left eye w/o correction: 20 / 20 Right Eye w/o correction: 20 / 25 Both eyes w/o correction:  20/ 20 Left eye with correction: 20 / 25 Right eye with correction: 20 / 25 Both eyes with correction: 20 / 15  Color vision testing: normal      Vision Entered By: Emilio Math (Nov 08, 2010 9:07 AM)    Current Allergies: No known allergies History of Present Illness Chief Complaint: Left eye pain from getting poked by a finger x 4 days ago History of Present Illness:  Subjective:  Patient reports that a family member accidentally struck her left eye with a finger 4 days ago.  She continues to have discomfort and mild swelling, especially of the upper eyelid.  She has noted increasing mucoid drainage from the eye, especially in the morning.  No change in vision.  She is having difficulty sleeping as a result of eye pain.  Current Meds ZYRTEC-D  TB12 (CETIRIZINE-PSEUDOEPHEDRINE TB12) 1 tab by mouth two times a day prn FLONASE 50 MCG/ACT SUSP (FLUTICASONE PROPIONATE) 2 sprays per nostril once daily     NARATRIPTAN HCL 2.5 MG TABS (NARATRIPTAN HCL) 1 tab by mouth x 1; repeat in 4 hrs if needed AMBIEN 10 MG TABS (ZOLPIDEM TARTRATE) 1 tab by mouth at bedtime as needed sleep HYDROCHLOROTHIAZIDE 12.5 MG  TABS (HYDROCHLOROTHIAZIDE) 1 tab by mouth daily PROAIR HFA 108 (90 BASE) MCG/ACT AERS (ALBUTEROL SULFATE) 2 puffs 4 x a day  prn ATENOLOL 25 MG TABS (ATENOLOL) 1 tab by mouth  daily ERYTHROMYCIN 5 MG/GM OINT (ERYTHROMYCIN) Apply in affected eye q2 to 3hr TRAMADOL HCL 50 MG TABS (TRAMADOL HCL) One tab by mouth hs as needed for pain  REVIEW OF SYSTEMS Constitutional Symptoms      Denies fever, chills, night sweats, weight loss, weight gain, and fatigue.  Eyes       Complains of eye pain and eye drainage.      Denies change in vision, glasses, contact lenses, and eye surgery. Ear/Nose/Throat/Mouth       Denies hearing loss/aids, change in hearing, ear pain, ear discharge, dizziness, frequent runny nose, frequent nose bleeds, sinus problems, sore throat, hoarseness, and tooth pain or bleeding.  Respiratory       Denies dry cough, productive cough, wheezing, shortness of breath, asthma, bronchitis, and emphysema/COPD.  Cardiovascular       Denies murmurs, chest pain, and tires easily with exhertion.    Gastrointestinal       Denies stomach pain, nausea/vomiting, diarrhea, constipation, blood in bowel movements, and indigestion. Genitourniary       Denies painful urination, kidney stones, and loss of urinary control. Neurological  Denies paralysis, seizures, and fainting/blackouts. Musculoskeletal       Denies muscle pain, joint pain, joint stiffness, decreased range of motion, redness, swelling, muscle weakness, and gout.  Skin       Denies bruising, unusual mles/lumps or sores, and hair/skin or nail changes.  Psych       Denies mood changes, temper/anger issues, anxiety/stress, speech problems, depression, and sleep problems.  Past History:  Past Medical History: Reviewed history from 10/24/2006 and no changes required. AR migraines  Past Surgical History: Reviewed history from 01/20/2008 and no changes required. 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 DM, hi chol-mother  HTN-dad  Prostate CA-father  Social History: Reviewed history from 01/20/2008 and no  changes required. 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  Sexually active w/ boyfriend.     Objective:  No acute distress  Eyes:  Pupils are equal, round, and reactive to light and accomdation.  Extraocular movement is intact.   Left conjunctivae mildly injected.  Mild swelling and tenderness of the left upper eyelid but no erythema.  Left lid eversion reveals no foreign body or injury.  Fundi benign.  Left anterior chamber clear.  No photophobia.  Fluorescein reveals uptake in the conjunctivae at position 4 o'clock, and faint hazy uptake on the lower cornea.  No rash Assessment New Problems: CONJUNCTIVITIS, ACUTE, LEFT (ICD-372.00) UNSPECIFIED CONTUSION OF EYE (ICD-921.9)   Plan New Medications/Changes: TRAMADOL HCL 50 MG TABS (TRAMADOL HCL) One tab by mouth hs as needed for pain  #8 (eight) x 0, 11/08/2010, Donna Christen MD ERYTHROMYCIN 5 MG/GM OINT (ERYTHROMYCIN) Apply in affected eye q2 to 3hr  #3.5gm x 0, 11/08/2010, Donna Christen MD  New Orders: New Patient Level III [phone] Planning Comments:   Continue warm compresses.  Begin applying erythromycin ophthalmic ointment.  May continue Aleve.  Analgesic at bedtime.  If not cleared within 3 to 4 days, or if symptoms worsen, follow-up with ophthalmologist.   The patient and/or caregiver has been counseled thoroughly with regard to medications prescribed including dosage, schedule, interactions, rationale for use, and possible side effects and they verbalize understanding.  Diagnoses and expected course of recovery discussed and will return if not improved as expected or if the condition worsens. Patient and/or caregiver verbalized understanding.  Prescriptions: TRAMADOL HCL 50 MG TABS (TRAMADOL HCL) One tab by mouth hs as needed for pain  #8 (eight) x 0   Entered and Authorized by:   Donna Christen MD   Signed by:   Donna Christen MD on 11/08/2010    Method used:   Print then Give to Patient   RxID:   [id number] ERYTHROMYCIN 5 MG/GM OINT (ERYTHROMYCIN) Apply in affected eye q2 to 3hr  #3.5gm x 0   Entered and Authorized by:   Donna Christen MD   Signed by:   Donna Christen MD on 11/08/2010   Method used:   Print then Give to Patient   RxID:   [id number]   Orders Added: 1)  New Patient Level III [29528]

## 2011-08-07 ENCOUNTER — Encounter: Payer: Self-pay | Admitting: *Deleted

## 2011-10-14 ENCOUNTER — Ambulatory Visit (INDEPENDENT_AMBULATORY_CARE_PROVIDER_SITE_OTHER): Payer: 59 | Admitting: Physician Assistant

## 2011-10-14 ENCOUNTER — Encounter: Payer: Self-pay | Admitting: Physician Assistant

## 2011-10-14 VITALS — BP 149/83 | HR 112 | Temp 98.5°F | Ht 65.5 in | Wt 215.0 lb

## 2011-10-14 DIAGNOSIS — J329 Chronic sinusitis, unspecified: Secondary | ICD-10-CM

## 2011-10-14 DIAGNOSIS — R05 Cough: Secondary | ICD-10-CM

## 2011-10-14 DIAGNOSIS — R059 Cough, unspecified: Secondary | ICD-10-CM

## 2011-10-14 DIAGNOSIS — Z23 Encounter for immunization: Secondary | ICD-10-CM

## 2011-10-14 MED ORDER — HYDROCODONE-HOMATROPINE 5-1.5 MG/5ML PO SYRP: 5.0000 mL | ORAL_SOLUTION | Freq: Every evening | ORAL | Status: AC | PRN

## 2011-10-14 MED ORDER — AMOXICILLIN-POT CLAVULANATE 875-125 MG PO TABS: 1.0000 | ORAL_TABLET | Freq: Two times a day (BID) | ORAL | Status: AC

## 2011-10-14 NOTE — Patient Instructions (Signed)
Start Augmentin twice a day for 10 days. Can use hycodan for cough at night to take at bedtime. Follow up if not improving. Consider anti-histamine when going to be outside or during spring season.

## 2011-10-14 NOTE — Progress Notes (Signed)
  Subjective:    Patient ID: Wanda Ortiz, female    DOB: 03/23/1961, 51 y.o.   MRN: [id number]  Sinusitis This is a new problem. The current episode started 1 to 4 weeks ago. The problem has been gradually worsening since onset. There has been no fever. The pain is mild. Associated symptoms include congestion, coughing, headaches, shortness of breath, sinus pressure and sneezing. Pertinent negatives include no chills, ear pain, hoarse voice, sore throat or swollen glands. (Cough is worse at night and makes it to where she can't sleep.) Treatments tried: Mucinex and sinus and allergy  The treatment provided no relief.    No bp meds today.   Review of Systems  Constitutional: Negative for chills.  HENT: Positive for congestion, sneezing and sinus pressure. Negative for ear pain, sore throat and hoarse voice.   Respiratory: Positive for cough and shortness of breath.   Neurological: Positive for headaches.       Objective:   Physical Exam  Constitutional: She is oriented to person, place, and time. She appears well-developed and well-nourished.  HENT:  Head: Normocephalic and atraumatic.  Right Ear: External ear normal.  Left Ear: External ear normal.  Mouth/Throat: Oropharynx is clear and moist.  Eyes: Conjunctivae are normal.  Neck: Normal range of motion. Neck supple.  Cardiovascular: Regular rhythm and normal heart sounds.        Tachycardia at 112.   Pulmonary/Chest: Effort normal and breath sounds normal. She has no wheezes.  Lymphadenopathy:    She has no cervical adenopathy.  Neurological: She is alert and oriented to person, place, and time.  Skin: Skin is warm and dry.  Psychiatric: She has a normal mood and affect. Her behavior is normal.          Assessment & Plan:  Sinusitis- Start Augmentin twice a day for 10 days. Continue with mucinex. Follow up if not improving. Stay on Zyrtec in the spring season and when going to be outside for long amts of time.    Cough- Cough during the day. Hycodan to use at bedtime to help rest.   Elevated blood pressure- did not take HCTZ today. Will recheck at next visit we may need to start on another medication at that point if still elevated.   Tdap was given.

## 2011-10-18 ENCOUNTER — Telehealth: Payer: Self-pay | Admitting: *Deleted

## 2011-10-18 MED ORDER — FLUCONAZOLE 150 MG PO TABS: 150.0000 mg | ORAL_TABLET | Freq: Once | ORAL | Status: AC

## 2011-10-18 NOTE — Telephone Encounter (Signed)
LMOM

## 2011-10-18 NOTE — Telephone Encounter (Signed)
Pt states that she has gotten a yeast infection from the abx she was given on Monday. Pt would like to know if we can call in something for the yeast infection.

## 2011-10-18 NOTE — Telephone Encounter (Signed)
rx sent to pharm

## 2011-12-23 ENCOUNTER — Ambulatory Visit: Payer: 59 | Admitting: Physician Assistant

## 2012-02-07 ENCOUNTER — Other Ambulatory Visit: Payer: Self-pay | Admitting: Family Medicine

## 2012-02-07 DIAGNOSIS — Z1231 Encounter for screening mammogram for malignant neoplasm of breast: Secondary | ICD-10-CM

## 2012-02-14 ENCOUNTER — Encounter: Payer: Self-pay | Admitting: Family Medicine

## 2012-02-14 ENCOUNTER — Ambulatory Visit (INDEPENDENT_AMBULATORY_CARE_PROVIDER_SITE_OTHER): Payer: 59 | Admitting: Family Medicine

## 2012-02-14 VITALS — BP 138/86 | HR 90 | Temp 98.4°F | Resp 18 | Wt 210.0 lb

## 2012-02-14 DIAGNOSIS — J209 Acute bronchitis, unspecified: Secondary | ICD-10-CM

## 2012-02-14 DIAGNOSIS — B9689 Other specified bacterial agents as the cause of diseases classified elsewhere: Secondary | ICD-10-CM

## 2012-02-14 MED ORDER — HYDROCOD POLST-CHLORPHEN POLST 10-8 MG/5ML PO LQCR: 5.0000 mL | Freq: Every evening | ORAL | Status: DC | PRN

## 2012-02-14 MED ORDER — DOXYCYCLINE HYCLATE 100 MG PO TABS: 100.0000 mg | ORAL_TABLET | Freq: Two times a day (BID) | ORAL | Status: AC

## 2012-02-14 MED ORDER — FLUCONAZOLE 150 MG PO TABS: 150.0000 mg | ORAL_TABLET | Freq: Once | ORAL | Status: AC

## 2012-02-14 NOTE — Progress Notes (Signed)
CC: Wanda Ortiz is a 51 y.o. female is here for Nasal Congestion and Cough   Subjective: HPI:  2-3 days of worsening cough associated with for head pressure, runny nose, subjective fevers and chills. Cough is described as productive producing brown nonbloody sputum. Cough is throughout the day, keeps her up at night. She's been using over-the-counter cough suppressants and decongestants without improvement. She is a smoker. She denies shortness of breath unless she's coughing. Denies confusion, orthopnea, peripheral edema, chest pain, back pain, hearing difficulties. Sick contacts at home    Review Of Systems Outlined In HPI  Past Medical History  Diagnosis Date  . AR (allergic rhinitis)   . Migraines   . Hypertension   . Hyperlipemia      Family History  Problem Relation Age of Onset  . Diabetes Mother   . Hyperlipidemia Mother   . Hypertension Father   . Cancer Father     prostate     History  Substance Use Topics  . Smoking status: Current Everyday Smoker -- 0.3 packs/day for 15 years    Types: Cigarettes  . Smokeless tobacco: Not on file  . Alcohol Use: 1.0 oz/week    2 drink(s) per week     Objective: Filed Vitals:   02/14/12 0847  BP: 138/86  Pulse: 90  Temp: 98.4 F (36.9 C)  Resp: 18    General: Alert and Oriented, No Acute Distress HEENT: Pupils equal, round, reactive to light. Conjunctivae clear.  External ears unremarkable, canals clear with intact TMs with appropriate landmarks.  Middle ear appears open without effusion. Pink inferior turbinates.  Moist mucous membranes, pharynx without inflammation nor lesions.  Neck supple without palpable lymphadenopathy nor abnormal masses. Frontal sinuses tender to palpations Lungs: Diffuse mild wheezing with mild central ronchi.  Comfortable work of breathing. Good air movement. Cardiac: Regular rate and rhythm. Normal S1/S2.  No murmurs, rubs, nor gallops.   Extremities: No peripheral edema.  Strong  peripheral pulses.  Mental Status: No depression, anxiety, nor agitation. Skin: Warm and dry.  Assessment & Plan: Wanda Ortiz was seen today for nasal congestion and cough.  Diagnoses and associated orders for this visit:  Acute bacterial bronchitis - doxycycline (VIBRA-TABS) 100 MG tablet; Take 1 tablet (100 mg total) by mouth 2 (two) times daily. - fluconazole (DIFLUCAN) 150 MG tablet; Take 1 tablet (150 mg total) by mouth once. At end of antibiotic regimen if needed. - chlorpheniramine-HYDROcodone (TUSSIONEX PENNKINETIC ER) 10-8 MG/5ML LQCR; Take 5 mLs by mouth at bedtime as needed.    Due to smoking status and lung exam I feel she warrants an antibiotic, she tells me she did his infections after most antibiotic regimens therefore Diflucan provided. Given interference with her sleep Tussionex offered. Encouraged her to return on Monday if not improving.Signs and symptoms requring emergent/urgent reevaluation were discussed with the patient..  Return if symptoms worsen or fail to improve.  Requested Prescriptions   Signed Prescriptions Disp Refills  . doxycycline (VIBRA-TABS) 100 MG tablet 20 tablet 0    Sig: Take 1 tablet (100 mg total) by mouth 2 (two) times daily.  . fluconazole (DIFLUCAN) 150 MG tablet 1 tablet 0    Sig: Take 1 tablet (150 mg total) by mouth once. At end of antibiotic regimen if needed.  . chlorpheniramine-HYDROcodone (TUSSIONEX PENNKINETIC ER) 10-8 MG/5ML LQCR 115 mL 0    Sig: Take 5 mLs by mouth at bedtime as needed.

## 2012-02-25 ENCOUNTER — Ambulatory Visit (INDEPENDENT_AMBULATORY_CARE_PROVIDER_SITE_OTHER): Payer: 59

## 2012-02-25 ENCOUNTER — Ambulatory Visit: Payer: 59

## 2012-02-25 DIAGNOSIS — Z1231 Encounter for screening mammogram for malignant neoplasm of breast: Secondary | ICD-10-CM

## 2012-03-10 ENCOUNTER — Telehealth: Payer: Self-pay

## 2012-03-10 NOTE — Telephone Encounter (Signed)
Dr Linford Arnold,  I mailed a letter to patient about her normal mammogram and it was returned insufficient address. I called patient's home/mobile number and it has been disconnected or changed.

## 2012-09-24 ENCOUNTER — Ambulatory Visit (INDEPENDENT_AMBULATORY_CARE_PROVIDER_SITE_OTHER): Payer: 59 | Admitting: Sports Medicine

## 2012-09-24 ENCOUNTER — Encounter: Payer: Self-pay | Admitting: Sports Medicine

## 2012-09-24 VITALS — BP 143/89 | HR 81 | Wt 211.0 lb

## 2012-09-24 DIAGNOSIS — A088 Other specified intestinal infections: Secondary | ICD-10-CM

## 2012-09-24 DIAGNOSIS — A084 Viral intestinal infection, unspecified: Secondary | ICD-10-CM | POA: Insufficient documentation

## 2012-09-24 MED ORDER — PROMETHAZINE HCL 25 MG PO TABS: 25.0000 mg | ORAL_TABLET | Freq: Four times a day (QID) | ORAL | Status: DC | PRN

## 2012-09-24 MED ORDER — DIPHENOXYLATE-ATROPINE 2.5-0.025 MG PO TABS: ORAL_TABLET | ORAL | Status: DC

## 2012-09-24 MED ORDER — NAPROXEN 500 MG PO TABS: 500.0000 mg | ORAL_TABLET | Freq: Two times a day (BID) | ORAL | Status: DC

## 2012-09-24 NOTE — Progress Notes (Signed)
  Subjective:    CC: Sick  HPI: Wanda Ortiz is had several days of nausea, vomiting, diarrhea several times per day. She denies hematochezia, or hematemesis, or melena. She denies fevers or chills but she does have some body aches. She denies any rashes, nasal discharge. He does have several sick contacts with similar symptoms. She is able to keep down solids and liquids. Abdominal pain is present in the epigastrium but is relatively mild.  Past medical history, Surgical history, Family history not pertinant except as noted below, Social history, Allergies, and medications have been entered into the medical record, reviewed, and no changes needed.   Review of Systems: No fevers, chills, night sweats, weight loss, chest pain, or shortness of breath.   Objective:    General: Well Developed, well nourished, and in no acute distress.  Neuro: Alert and oriented x3, extra-ocular muscles intact, sensation grossly intact.  HEENT: Normocephalic, atraumatic, pupils equal round reactive to light, neck supple, no masses, no lymphadenopathy, thyroid nonpalpable.  Skin: Warm and dry, no rashes. Cardiac: Regular rate and rhythm, no murmurs rubs or gallops.  Respiratory: Clear to auscultation bilaterally. Not using accessory muscles, speaking in full sentences. Abdomen: Soft, mildly tender to palpation in the epigastrium, no rigidity or guarding, no palpable masses.  Impression and Recommendations:

## 2012-09-24 NOTE — Patient Instructions (Addendum)
Viral Gastroenteritis Viral gastroenteritis is also known as stomach flu. This condition affects the stomach and intestinal tract. It can cause sudden diarrhea and vomiting. The illness typically lasts 3 to 8 days. Most people develop an immune response that eventually gets rid of the virus. While this natural response develops, the virus can make you quite ill. CAUSES  Many different viruses can cause gastroenteritis, such as rotavirus or noroviruses. You can catch one of these viruses by consuming contaminated food or water. You may also catch a virus by sharing utensils or other personal items with an infected person or by touching a contaminated surface. SYMPTOMS  The most common symptoms are diarrhea and vomiting. These problems can cause a severe loss of body fluids (dehydration) and a body salt (electrolyte) imbalance. Other symptoms may include:  Fever.  Headache.  Fatigue.  Abdominal pain. DIAGNOSIS  Your caregiver can usually diagnose viral gastroenteritis based on your symptoms and a physical exam. A stool sample may also be taken to test for the presence of viruses or other infections. TREATMENT  This illness typically goes away on its own. Treatments are aimed at rehydration. The most serious cases of viral gastroenteritis involve vomiting so severely that you are not able to keep fluids down. In these cases, fluids must be given through an intravenous line (IV). HOME CARE INSTRUCTIONS   Drink enough fluids to keep your urine clear or pale yellow. Drink small amounts of fluids frequently and increase the amounts as tolerated.  Ask your caregiver for specific rehydration instructions.  Avoid:  Foods high in sugar.  Alcohol.  Carbonated drinks.  Tobacco.  Juice.  Caffeine drinks.  Extremely hot or cold fluids.  Fatty, greasy foods.  Too much intake of anything at one time.  Dairy products until 24 to 48 hours after diarrhea stops.  You may consume probiotics.  Probiotics are active cultures of beneficial bacteria. They may lessen the amount and number of diarrheal stools in adults. Probiotics can be found in yogurt with active cultures and in supplements.  Wash your hands well to avoid spreading the virus.  Only take over-the-counter or prescription medicines for pain, discomfort, or fever as directed by your caregiver. Do not give aspirin to children. Antidiarrheal medicines are not recommended.  Ask your caregiver if you should continue to take your regular prescribed and over-the-counter medicines.  Keep all follow-up appointments as directed by your caregiver. SEEK IMMEDIATE MEDICAL CARE IF:   You are unable to keep fluids down.  You do not urinate at least once every 6 to 8 hours.  You develop shortness of breath.  You notice blood in your stool or vomit. This may look like coffee grounds.  You have abdominal pain that increases or is concentrated in one small area (localized).  You have persistent vomiting or diarrhea.  You have a fever.  The patient is a child younger than 3 months, and he or she has a fever.  The patient is a child older than 3 months, and he or she has a fever and persistent symptoms.  The patient is a child older than 3 months, and he or she has a fever and symptoms suddenly get worse.  The patient is a baby, and he or she has no tears when crying. MAKE SURE YOU:   Understand these instructions.  Will watch your condition.  Will get help right away if you are not doing well or get worse. Document Released: 06/17/2005 Document Revised: 09/09/2011 Document Reviewed: 04/03/2011   [employer] Patient Information 2013 [employer]  

## 2012-09-24 NOTE — Assessment & Plan Note (Signed)
No red flags, we will treat conservatively with Phenergan, Lomotil, naproxen. She will return if no better in a couple of weeks. Aggressive hydration, out of work for 2 days.

## 2013-01-28 ENCOUNTER — Other Ambulatory Visit: Payer: Self-pay | Admitting: Family Medicine

## 2013-01-28 DIAGNOSIS — Z1231 Encounter for screening mammogram for malignant neoplasm of breast: Secondary | ICD-10-CM

## 2013-03-04 ENCOUNTER — Ambulatory Visit (INDEPENDENT_AMBULATORY_CARE_PROVIDER_SITE_OTHER): Payer: 59

## 2013-03-04 ENCOUNTER — Ambulatory Visit: Payer: 59

## 2013-03-04 DIAGNOSIS — Z1231 Encounter for screening mammogram for malignant neoplasm of breast: Secondary | ICD-10-CM

## 2013-03-05 ENCOUNTER — Encounter: Payer: Self-pay | Admitting: *Deleted

## 2013-03-05 ENCOUNTER — Telehealth: Payer: Self-pay | Admitting: *Deleted

## 2013-03-05 NOTE — Telephone Encounter (Signed)
Called and informed pt of results and sent letter for her records.Wanda Ortiz

## 2013-03-08 ENCOUNTER — Ambulatory Visit (HOSPITAL_BASED_OUTPATIENT_CLINIC_OR_DEPARTMENT_OTHER): Payer: 59

## 2013-03-09 ENCOUNTER — Ambulatory Visit: Payer: 59

## 2013-03-22 ENCOUNTER — Ambulatory Visit (INDEPENDENT_AMBULATORY_CARE_PROVIDER_SITE_OTHER): Payer: 59 | Admitting: Physician Assistant

## 2013-03-22 ENCOUNTER — Encounter: Payer: Self-pay | Admitting: Physician Assistant

## 2013-03-22 VITALS — BP 154/87 | HR 89 | Wt 210.0 lb

## 2013-03-22 DIAGNOSIS — M7520 Bicipital tendinitis, unspecified shoulder: Secondary | ICD-10-CM

## 2013-03-22 DIAGNOSIS — M7522 Bicipital tendinitis, left shoulder: Secondary | ICD-10-CM

## 2013-03-22 MED ORDER — MELOXICAM 15 MG PO TABS: 15.0000 mg | ORAL_TABLET | Freq: Every day | ORAL | Status: DC

## 2013-03-22 MED ORDER — TRAMADOL HCL 50 MG PO TABS: 50.0000 mg | ORAL_TABLET | Freq: Four times a day (QID) | ORAL | Status: DC | PRN

## 2013-03-22 MED ORDER — PREDNISONE 50 MG PO TABS: ORAL_TABLET | ORAL | Status: DC

## 2013-03-22 NOTE — Progress Notes (Signed)
  Subjective:    Patient ID: Wanda Ortiz, female    DOB: 03/26/1961, 52 y.o.   MRN: [id number]  HPI Patient is a 52 year old female who presents to the clinic with left shoulder pain for the last 4-6 weeks. Patient denies any known trauma to the shoulder. 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 Pain in left shoulder is anterior and began with a dull ache that has increased to sharp shooting and even sometimes throbbing pain. She has taken Aleve 2 tabs twice a day and does help but not enough to keep her out of pain. Last week she was at work and tried to get up using her left arm and the pain was so intense she began to cry.   Review of Systems     Objective:   Physical Exam  Constitutional: She appears well-developed and well-nourished.  Musculoskeletal:  Left shoulder ROM decreased due to pain with abduction and lateral movements. Pain with palpation over anterior shoulder at coracoid process and down over bicep. Strength of left arm 3/5.          Assessment & Plan:  Bicipital tendinitis-  I do think there is some tendinitis of the bicipital tendon that connects the coracoid. I gave mobic for pt to start once a day for the neck one to 2 weeks. Patient was instructed to stop all other anti-inflammatories. Since patient reports pain that causes her to cry and did give her 30 tablets of tramadol to use for breakthrough pain as needed. Small 5 day course of prednisone given to also help with inflammation. Patient was given home exercise to start along with icing area up to 4 times a day. Patient was instructed to call if not improving in the next 2 weeks and we can consider an injection.

## 2013-04-22 ENCOUNTER — Ambulatory Visit (INDEPENDENT_AMBULATORY_CARE_PROVIDER_SITE_OTHER): Payer: 59

## 2013-04-22 ENCOUNTER — Encounter: Payer: Self-pay | Admitting: Family Medicine

## 2013-04-22 ENCOUNTER — Ambulatory Visit (INDEPENDENT_AMBULATORY_CARE_PROVIDER_SITE_OTHER): Payer: 59 | Admitting: Family Medicine

## 2013-04-22 VITALS — BP 143/88 | HR 107 | Wt 208.0 lb

## 2013-04-22 DIAGNOSIS — Z23 Encounter for immunization: Secondary | ICD-10-CM

## 2013-04-22 DIAGNOSIS — M7552 Bursitis of left shoulder: Secondary | ICD-10-CM

## 2013-04-22 DIAGNOSIS — M7522 Bicipital tendinitis, left shoulder: Secondary | ICD-10-CM

## 2013-04-22 DIAGNOSIS — M67919 Unspecified disorder of synovium and tendon, unspecified shoulder: Secondary | ICD-10-CM

## 2013-04-22 DIAGNOSIS — M7520 Bicipital tendinitis, unspecified shoulder: Secondary | ICD-10-CM

## 2013-04-22 DIAGNOSIS — M25519 Pain in unspecified shoulder: Secondary | ICD-10-CM

## 2013-04-22 MED ORDER — FLUTICASONE PROPIONATE 50 MCG/ACT NA SUSP: 2.0000 | Freq: Every day | NASAL | Status: DC

## 2013-04-22 MED ORDER — TRAMADOL HCL 50 MG PO TABS: 50.0000 mg | ORAL_TABLET | Freq: Four times a day (QID) | ORAL | Status: DC | PRN

## 2013-04-22 MED ORDER — MELOXICAM 15 MG PO TABS: 15.0000 mg | ORAL_TABLET | Freq: Every day | ORAL | Status: DC

## 2013-04-22 MED ORDER — ZOLPIDEM TARTRATE 10 MG PO TABS: 10.0000 mg | ORAL_TABLET | Freq: Every evening | ORAL | Status: DC | PRN

## 2013-04-22 NOTE — Patient Instructions (Signed)
Schedule an appt with Dr. Karie Schwalbe

## 2013-04-22 NOTE — Progress Notes (Signed)
  Subjective:    Patient ID: Wanda Ortiz, female    DOB: May 16, 1961, 53 y.o.   MRN: [id number]  HPI Here to followup for biceps tendinitis of the left shoulder. Has had pain in the front of the left shoulder fro well over a month at this point.  She also complains of pain that radiates across the shoulder. Says has been taing her tramadol and her mobic in the evening and it wears off sometimes in the middle of the night and the pain and discomfort wakes her up. She's unable to sleep on that shoulder.. Worse with certain movements, but says it really hurts all the time. She tries to avoid driving with that shoulder.  Has looked swollen to her.  No intial trauma or injury. No old surgeries. She does get some relief with the tramadol and the medic. She also completed a short course of steroids at the beginning..    Review of Systems     Objective:   Physical Exam  Constitutional: She appears well-developed and well-nourished.  HENT:  Head: Normocephalic and atraumatic.  Musculoskeletal:  Left shoulder with extension to about 90. Negative empty can test bilaterally. She does have significant pain with crossover and with reaching behind her back. Most of her pain is over the biceps tendon but she also is tender posteriorly. No rashes or significant swelling.  Skin: Skin is warm and dry.  Psychiatric: She has a normal mood and affect. Her behavior is normal.          Assessment & Plan:  Biceps tendinitis/shoulder bursitis-based on exam today I also think she has a component of shoulder bursitis. I would like to get her in with her sports medicine physician for further workup and evaluation since she is not improving with conservative therapy including NSAID, stretches. She may benefit from a injection. I did refill her MOBIC and tramadol today. I did go ahead and order x-rays today as well just make sure that the bony structures are okay, though she had no trauma or injury.

## 2013-04-23 ENCOUNTER — Encounter: Payer: Self-pay | Admitting: Sports Medicine

## 2013-04-23 ENCOUNTER — Ambulatory Visit (INDEPENDENT_AMBULATORY_CARE_PROVIDER_SITE_OTHER): Payer: 59 | Admitting: Sports Medicine

## 2013-04-23 VITALS — BP 156/96 | HR 82 | Wt 207.0 lb

## 2013-04-23 DIAGNOSIS — M75102 Unspecified rotator cuff tear or rupture of left shoulder, not specified as traumatic: Secondary | ICD-10-CM | POA: Insufficient documentation

## 2013-04-23 DIAGNOSIS — M67919 Unspecified disorder of synovium and tendon, unspecified shoulder: Secondary | ICD-10-CM

## 2013-04-23 NOTE — Assessment & Plan Note (Signed)
Guided subacromial injection as above. Formal physical therapy. Continue oral analgesics. Return in one month.

## 2013-04-23 NOTE — Progress Notes (Signed)
  Subjective:    CC: Left shoulder pain  HPI: This is a pleasant 52 year old female who presents with one month of left shoulder pain. It began gradually, and she denies any associated trauma. The pain begins in the anterior shoulder and radiates laterally. It is moderate and persistent, worse when she raises her arm above her head or reaches backward. The pain keeps her up at night and she is unable to sleep on that side. She has been taking Meloxicam as well as Tramadol for the pain, which helps. Moderate, persistent. No neck pain, no numbness or tingling in her fingers.  Past medical history, Surgical history, Family history not pertinant except as noted below, Social history, Allergies, and medications have been entered into the medical record, reviewed, and no changes needed.   Review of Systems: No fevers, chills, night sweats, weight loss, chest pain, or shortness of breath.   Objective:    General: Well Developed, well nourished, and in no acute distress.  Neuro: Alert and oriented x3, extra-ocular muscles intact, sensation grossly intact.  HEENT: Normocephalic, atraumatic, pupils equal round reactive to light, neck supple, no masses, no lymphadenopathy, thyroid nonpalpable.  Skin: Warm and dry, no rashes. Cardiac: Regular rate and rhythm, no murmurs rubs or gallops, no lower extremity edema.  Respiratory: Clear to auscultation bilaterally. Not using accessory muscles, speaking in full sentences. Left Shoulder: Inspection reveals no abnormalities, atrophy or asymmetry. Tender to palpation across supraspinatus ROM is limited by pain Rotator cuff strength normal throughout. Positive empty can and lift-off tests. Positive Hawkins, Neer's test. Speeds and Yergason's tests normal. Normal scapular function observed. No painful arc and no drop arm sign. No apprehension sign  X-rays were reviewed and are unremarkable.  Procedure: Real-time Ultrasound Guided Injection of left  subacromial bursa Device: GE Logiq E  Verbal informed consent obtained.  Time-out conducted.  Noted no overlying erythema, induration, or other signs of local infection.  Skin prepped in a sterile fashion.  Local anesthesia: Topical Ethyl chloride.  With sterile technique and under real time ultrasound guidance:  25-gauge needle advanced into the bursa, 1 cc Kenalog 40, 4 cc lidocaine injected easily. Completed without difficulty  Pain immediately resolved suggesting accurate placement of the medication.  Advised to call if fevers/chills, erythema, induration, drainage, or persistent bleeding.  Images permanently stored and available for review in the ultrasound unit.  Impression: Technically successful ultrasound guided injection.  Impression and Recommendations:   Assessment: This is a 52 year old female with symptoms consistent with rotator cuff tendinopathy.  Plan: 1. Begin formal physical therapy 2. Glucocorticoid injection today 3. Return for follow-up in 1 month  This note was originally written by Karin Lieu MS3.

## 2013-04-28 ENCOUNTER — Ambulatory Visit (INDEPENDENT_AMBULATORY_CARE_PROVIDER_SITE_OTHER): Payer: 59 | Admitting: Sports Medicine

## 2013-04-28 ENCOUNTER — Encounter: Payer: Self-pay | Admitting: Sports Medicine

## 2013-04-28 VITALS — BP 145/85 | HR 67 | Wt 206.0 lb

## 2013-04-28 DIAGNOSIS — M75102 Unspecified rotator cuff tear or rupture of left shoulder, not specified as traumatic: Secondary | ICD-10-CM

## 2013-04-28 DIAGNOSIS — M67919 Unspecified disorder of synovium and tendon, unspecified shoulder: Secondary | ICD-10-CM

## 2013-04-28 NOTE — Progress Notes (Signed)
  Subjective:    CC: Recheck shoulder  HPI: Left rotator cuff syndrome: This very pleasant 52 year old female comes back, I injected her shoulder under guidance of the last visit, she had approximately 4 hours of complete relief, and then the pain came back. Now she is worsening pain she localizes over the deltoid, worse with overhead activities. Moderate, persistent, radiating over the deltoid.  Past medical history, Surgical history, Family history not pertinant except as noted below, Social history, Allergies, and medications have been entered into the medical record, reviewed, and no changes needed.   Review of Systems: No fevers, chills, night sweats, weight loss, chest pain, or shortness of breath.   Objective:    General: Well Developed, well nourished, and in no acute distress.  Neuro: Alert and oriented x3, extra-ocular muscles intact, sensation grossly intact.  HEENT: Normocephalic, atraumatic, pupils equal round reactive to light, neck supple, no masses, no lymphadenopathy, thyroid nonpalpable.  Skin: Warm and dry, no rashes. Cardiac: Regular rate and rhythm, no murmurs rubs or gallops, no lower extremity edema.  Respiratory: Clear to auscultation bilaterally. Not using accessory muscles, speaking in full sentences. Left shoulder: Positive Neer's, Hawkins, and Empty Can signs  Procedure:  Injection of left subacromial space Consent obtained and verified. Time-out conducted. Noted no overlying erythema, induration, or other signs of local infection. Skin prepped in a sterile fashion. Topical analgesic spray: Ethyl chloride. Completed without difficulty. Meds: 2 cc lidocaine, 2 cc Marcaine, 1 cc Kenalog 40 injected easily into the subacromial space. Pain immediately improved suggesting accurate placement of the medication. Advised to call if fevers/chills, erythema, induration, drainage, or persistent bleeding. Impression and Recommendations:

## 2013-04-28 NOTE — Assessment & Plan Note (Signed)
Repeat subacromial injection without guidance. This time I added some Marcaine. Return in one month, if no better we will consider an MRI for surgical planning.

## 2013-05-05 ENCOUNTER — Ambulatory Visit: Payer: 59 | Admitting: Physical Therapy

## 2013-05-05 DIAGNOSIS — M719 Bursopathy, unspecified: Secondary | ICD-10-CM

## 2013-05-05 DIAGNOSIS — M25519 Pain in unspecified shoulder: Secondary | ICD-10-CM

## 2013-05-05 DIAGNOSIS — M67919 Unspecified disorder of synovium and tendon, unspecified shoulder: Secondary | ICD-10-CM

## 2013-05-05 DIAGNOSIS — M6281 Muscle weakness (generalized): Secondary | ICD-10-CM

## 2013-05-05 DIAGNOSIS — M25619 Stiffness of unspecified shoulder, not elsewhere classified: Secondary | ICD-10-CM

## 2013-05-10 ENCOUNTER — Encounter: Payer: 59 | Admitting: Physical Therapy

## 2013-05-10 DIAGNOSIS — M719 Bursopathy, unspecified: Secondary | ICD-10-CM

## 2013-05-10 DIAGNOSIS — M25519 Pain in unspecified shoulder: Secondary | ICD-10-CM

## 2013-05-10 DIAGNOSIS — M25619 Stiffness of unspecified shoulder, not elsewhere classified: Secondary | ICD-10-CM

## 2013-05-10 DIAGNOSIS — M6281 Muscle weakness (generalized): Secondary | ICD-10-CM

## 2013-05-10 DIAGNOSIS — M67919 Unspecified disorder of synovium and tendon, unspecified shoulder: Secondary | ICD-10-CM

## 2013-05-14 ENCOUNTER — Encounter: Payer: 59 | Admitting: Physical Therapy

## 2013-05-17 ENCOUNTER — Encounter: Payer: 59 | Admitting: Physical Therapy

## 2013-05-17 DIAGNOSIS — M719 Bursopathy, unspecified: Secondary | ICD-10-CM

## 2013-05-17 DIAGNOSIS — M25519 Pain in unspecified shoulder: Secondary | ICD-10-CM

## 2013-05-17 DIAGNOSIS — M25619 Stiffness of unspecified shoulder, not elsewhere classified: Secondary | ICD-10-CM

## 2013-05-17 DIAGNOSIS — M67919 Unspecified disorder of synovium and tendon, unspecified shoulder: Secondary | ICD-10-CM

## 2013-05-17 DIAGNOSIS — M6281 Muscle weakness (generalized): Secondary | ICD-10-CM

## 2013-05-21 ENCOUNTER — Telehealth: Payer: Self-pay | Admitting: *Deleted

## 2013-05-21 ENCOUNTER — Ambulatory Visit (INDEPENDENT_AMBULATORY_CARE_PROVIDER_SITE_OTHER): Payer: 59 | Admitting: Sports Medicine

## 2013-05-21 ENCOUNTER — Encounter: Payer: 59 | Admitting: Physical Therapy

## 2013-05-21 ENCOUNTER — Encounter: Payer: Self-pay | Admitting: Sports Medicine

## 2013-05-21 VITALS — BP 146/98 | HR 88 | Wt 202.0 lb

## 2013-05-21 DIAGNOSIS — M75102 Unspecified rotator cuff tear or rupture of left shoulder, not specified as traumatic: Secondary | ICD-10-CM

## 2013-05-21 DIAGNOSIS — M6281 Muscle weakness (generalized): Secondary | ICD-10-CM

## 2013-05-21 DIAGNOSIS — M25519 Pain in unspecified shoulder: Secondary | ICD-10-CM

## 2013-05-21 DIAGNOSIS — M67919 Unspecified disorder of synovium and tendon, unspecified shoulder: Secondary | ICD-10-CM

## 2013-05-21 DIAGNOSIS — M25619 Stiffness of unspecified shoulder, not elsewhere classified: Secondary | ICD-10-CM

## 2013-05-21 DIAGNOSIS — M719 Bursopathy, unspecified: Secondary | ICD-10-CM

## 2013-05-21 NOTE — Progress Notes (Signed)
  Subjective:    CC: Followup  HPI: Right shoulder pain: This pleasant female has had rotator cuff syndrome for some time now, she has been through physical therapy, NSAIDs, and two ultrasound-guided subacromial injections. Unfortunately she continues to have pain, worsening, localized over the deltoid, worse with overhead activities.  Past medical history, Surgical history, Family history not pertinant except as noted below, Social history, Allergies, and medications have been entered into the medical record, reviewed, and no changes needed.   Review of Systems: No fevers, chills, night sweats, weight loss, chest pain, or shortness of breath.   Objective:    General: Well Developed, well nourished, and in no acute distress.  Neuro: Alert and oriented x3, extra-ocular muscles intact, sensation grossly intact.  HEENT: Normocephalic, atraumatic, pupils equal round reactive to light, neck supple, no masses, no lymphadenopathy, thyroid nonpalpable.  Skin: Warm and dry, no rashes. Cardiac: Regular rate and rhythm, no murmurs rubs or gallops, no lower extremity edema.  Respiratory: Clear to auscultation bilaterally. Not using accessory muscles, speaking in full sentences.  Impression and Recommendations:

## 2013-05-21 NOTE — Telephone Encounter (Signed)
PA obtained for MRI LT shoulder w/o contrast.   Auth # [id number].  Meyer Cory, LPN

## 2013-05-21 NOTE — Assessment & Plan Note (Signed)
Status post 2 subacromial injections, physical therapy, NSAIDs, unfortunately pain is persistent. At this point we are going to order an MRI for surgical planning, and I am going to refer her to Dr. Jones Broom and she will likely need an arthroscopy.

## 2013-05-22 ENCOUNTER — Ambulatory Visit (HOSPITAL_BASED_OUTPATIENT_CLINIC_OR_DEPARTMENT_OTHER)
Admission: RE | Admit: 2013-05-22 | Discharge: 2013-05-22 | Disposition: A | Discharge status: Home / Self Care | Payer: 59 | Source: Ambulatory Visit | Attending: Sports Medicine | Admitting: Sports Medicine

## 2013-05-22 DIAGNOSIS — M79609 Pain in unspecified limb: Secondary | ICD-10-CM | POA: Insufficient documentation

## 2013-05-22 DIAGNOSIS — M67919 Unspecified disorder of synovium and tendon, unspecified shoulder: Secondary | ICD-10-CM | POA: Insufficient documentation

## 2013-05-22 DIAGNOSIS — M25519 Pain in unspecified shoulder: Secondary | ICD-10-CM | POA: Insufficient documentation

## 2013-05-22 DIAGNOSIS — M75102 Unspecified rotator cuff tear or rupture of left shoulder, not specified as traumatic: Secondary | ICD-10-CM

## 2013-05-22 DIAGNOSIS — M719 Bursopathy, unspecified: Secondary | ICD-10-CM | POA: Insufficient documentation

## 2013-05-24 ENCOUNTER — Encounter: Payer: 59 | Admitting: Physical Therapy

## 2013-05-24 DIAGNOSIS — M719 Bursopathy, unspecified: Secondary | ICD-10-CM

## 2013-05-24 DIAGNOSIS — M67919 Unspecified disorder of synovium and tendon, unspecified shoulder: Secondary | ICD-10-CM

## 2013-05-24 DIAGNOSIS — M6281 Muscle weakness (generalized): Secondary | ICD-10-CM

## 2013-05-24 DIAGNOSIS — M25619 Stiffness of unspecified shoulder, not elsewhere classified: Secondary | ICD-10-CM

## 2013-05-24 DIAGNOSIS — M25519 Pain in unspecified shoulder: Secondary | ICD-10-CM

## 2013-06-02 ENCOUNTER — Encounter: Payer: Self-pay | Admitting: *Deleted

## 2013-06-02 ENCOUNTER — Ambulatory Visit (INDEPENDENT_AMBULATORY_CARE_PROVIDER_SITE_OTHER): Payer: 59 | Admitting: Family Medicine

## 2013-06-02 VITALS — BP 152/98 | HR 89 | Temp 97.9°F | Wt 203.0 lb

## 2013-06-02 DIAGNOSIS — Z111 Encounter for screening for respiratory tuberculosis: Secondary | ICD-10-CM

## 2013-06-02 NOTE — Progress Notes (Signed)
Pt here to have PPD placed. Injection tolerated well in L forearm.

## 2013-06-22 ENCOUNTER — Ambulatory Visit (INDEPENDENT_AMBULATORY_CARE_PROVIDER_SITE_OTHER): Payer: 59 | Admitting: Physical Therapy

## 2013-06-22 DIAGNOSIS — M6281 Muscle weakness (generalized): Secondary | ICD-10-CM

## 2013-06-22 DIAGNOSIS — M25619 Stiffness of unspecified shoulder, not elsewhere classified: Secondary | ICD-10-CM

## 2013-06-22 DIAGNOSIS — M25519 Pain in unspecified shoulder: Secondary | ICD-10-CM

## 2013-06-28 ENCOUNTER — Encounter (INDEPENDENT_AMBULATORY_CARE_PROVIDER_SITE_OTHER): Payer: 59 | Admitting: Physical Therapy

## 2013-06-28 DIAGNOSIS — M6281 Muscle weakness (generalized): Secondary | ICD-10-CM

## 2013-06-28 DIAGNOSIS — M25519 Pain in unspecified shoulder: Secondary | ICD-10-CM

## 2013-06-28 DIAGNOSIS — M25619 Stiffness of unspecified shoulder, not elsewhere classified: Secondary | ICD-10-CM

## 2013-07-05 ENCOUNTER — Encounter: Payer: 59 | Admitting: Physical Therapy

## 2013-07-07 ENCOUNTER — Encounter: Payer: 59 | Admitting: Physical Therapy

## 2013-07-12 ENCOUNTER — Encounter: Payer: 59 | Admitting: Physical Therapy

## 2013-07-14 ENCOUNTER — Encounter: Payer: 59 | Admitting: Physical Therapy

## 2013-07-19 ENCOUNTER — Encounter: Payer: 59 | Admitting: Physical Therapy

## 2013-08-12 ENCOUNTER — Emergency Department
Admission: EM | Admit: 2013-08-12 | Discharge: 2013-08-12 | Disposition: A | Discharge status: Home / Self Care | Payer: 59 | Source: Home / Self Care | Attending: Family Medicine | Admitting: Family Medicine

## 2013-08-12 ENCOUNTER — Encounter: Payer: Self-pay | Admitting: Emergency Medicine

## 2013-08-12 ENCOUNTER — Emergency Department (INDEPENDENT_AMBULATORY_CARE_PROVIDER_SITE_OTHER): Payer: 59

## 2013-08-12 DIAGNOSIS — Q453 Other congenital malformations of pancreas and pancreatic duct: Secondary | ICD-10-CM

## 2013-08-12 DIAGNOSIS — N289 Disorder of kidney and ureter, unspecified: Secondary | ICD-10-CM

## 2013-08-12 DIAGNOSIS — K529 Noninfective gastroenteritis and colitis, unspecified: Secondary | ICD-10-CM

## 2013-08-12 DIAGNOSIS — R9389 Abnormal findings on diagnostic imaging of other specified body structures: Secondary | ICD-10-CM

## 2013-08-12 DIAGNOSIS — K5289 Other specified noninfective gastroenteritis and colitis: Secondary | ICD-10-CM

## 2013-08-12 LAB — POCT CBC W AUTO DIFF (K'VILLE URGENT CARE)

## 2013-08-12 MED ORDER — CIPROFLOXACIN HCL 500 MG PO TABS: 500.0000 mg | ORAL_TABLET | Freq: Two times a day (BID) | ORAL | Status: DC

## 2013-08-12 MED ORDER — IOHEXOL 300 MG/ML  SOLN
100.0000 mL | Freq: Once | INTRAMUSCULAR | Status: AC | PRN
  Administered 2013-08-12: 100 mL via INTRAVENOUS

## 2013-08-12 MED ORDER — METRONIDAZOLE 500 MG PO TABS: 500.0000 mg | ORAL_TABLET | Freq: Three times a day (TID) | ORAL | Status: DC

## 2013-08-12 NOTE — ED Provider Notes (Addendum)
CSN: [id number]     Arrival date & time 08/12/13  0844 History   First MD Initiated Contact with Patient 08/12/13 [id number]     Chief Complaint  Patient presents with  . Emesis      HPI  Diarrhea: Onset: 2 days  Description: NBNB emesis and diarrhea over past 2 days with subjective fevers and chills at home  Modifying factors: works in daycare   Symptoms: Incontinence: no Vomiting: yes Abdominal Pain: yes Urgency: yes Relief with defecation: mild Weight loss: no Decreased urine output: no Lightheadedness: no Recent travel history: no Sick contacts: yes Suspicious food or water: no Change in diet: no    Red Flags: Fever: subjective at home  Bloody stools: no Recent antibiotics: no Immuncompromised: no   Past Medical History  Diagnosis Date  . AR (allergic rhinitis)   . Migraines   . Hypertension   . Hyperlipemia    Past Surgical History  Procedure Laterality Date  . Abdominal hysterectomy      partial with BSO for Brenners Tumor   Family History  Problem Relation Age of Onset  . Diabetes Mother   . Hyperlipidemia Mother   . Hypertension Father   . Cancer Father     prostate   History  Substance Use Topics  . Smoking status: Current Every Day Smoker -- 0.30 packs/day for 15 years    Types: Cigarettes  . Smokeless tobacco: Not on file  . Alcohol Use: 1.0 oz/week    2 drink(s) per week   OB History   Grav Para Term Preterm Abortions TAB SAB Ect Mult Living                 Review of Systems  All other systems reviewed and are negative.      Allergies  Review of patient's allergies indicates not on file.  Home Medications   Current Outpatient Rx  Name  Route  Sig  Dispense  Refill  . acyclovir (ZOVIRAX) 200 MG capsule   Oral   Take by mouth 5 (five) times daily. X 5 prn for cold sores          . cetirizine-pseudoephedrine (ZYRTEC-D) 5-120 MG per tablet   Oral   Take 1 tablet by mouth daily as needed.          .  diphenoxylate-atropine (LOMOTIL) 2.5-0.025 MG per tablet      One to 2 tablets by mouth 4 times a day as needed for diarrhea.   30 tablet   3   . fluticasone (FLONASE) 50 MCG/ACT nasal spray   Nasal   Place 2 sprays into the nose daily.   16 g   3   . hydrochlorothiazide (MICROZIDE) 12.5 MG capsule   Oral   Take 1 capsule (12.5 mg total) by mouth daily.   90 capsule   1   . meloxicam (MOBIC) 15 MG tablet   Oral   Take 1 tablet (15 mg total) by mouth daily.   30 tablet   2   . naratriptan (AMERGE) 2.5 MG tablet   Oral   Take 2.5 mg by mouth as needed. Take one (1) tablet at onset of headache; if returns or does not resolve, may repeat after 4 hours; do not exceed five (5) mg in 24 hours.          . promethazine (PHENERGAN) 25 MG tablet   Oral   Take 1 tablet (25 mg total) by mouth every 6 (six) hours as  needed for nausea.   30 tablet   3   . traMADol (ULTRAM) 50 MG tablet   Oral   Take 1 tablet (50 mg total) by mouth every 6 (six) hours as needed for pain.   30 tablet   0   . zolpidem (AMBIEN) 10 MG tablet   Oral   Take 1 tablet (10 mg total) by mouth at bedtime as needed. For sleep   30 tablet   0    BP 142/100  Pulse 88  Temp(Src) 98.1 F (36.7 C) (Oral)  Ht 5\' 6"  (1.676 m)  Wt 206 lb (93.441 kg)  BMI 33.27 kg/m2  SpO2 98% Physical Exam  Constitutional: She appears well-developed and well-nourished.  HENT:  Head: Normocephalic and atraumatic.  Eyes: Conjunctivae are normal. Pupils are equal, round, and reactive to light.  Neck: Normal range of motion.  Cardiovascular: Normal rate and regular rhythm.   Pulmonary/Chest: Effort normal and breath sounds normal.  Abdominal:  Hyperactive bowel sounds + TTP diffusely, most predominant over RLQ and LLQ, mild guarding     Musculoskeletal: Normal range of motion.  Neurological: She is alert.  Skin: Skin is warm.    ED Course  Procedures (including critical care time) Labs Review Labs Reviewed - No  data to display Imaging Review No results found.    MDM   Final diagnoses:  Gastroenteritis    Sxs seem viral in etiology.  However, given abd pain, will send for CT abd and pelvis with IV and oral contrast to better assess anatomy WBC @ 7.8. Reassuring.  Will also consider obtaining stool studies including stool culture, fecal lactoferrin, c diff, stool ova and parasite.  Follow up pending CT scan     The patient and/or caregiver has been counseled thoroughly with regard to treatment plan and/or medications prescribed including dosage, schedule, interactions, rationale for use, and possible side effects and they verbalize understanding. Diagnoses and expected course of recovery discussed and will return if not improved as expected or if the condition worsens. Patient and/or caregiver verbalized understanding.          , MD 08/12/13 1007 -----------------------------------------------CLINICAL UPDATE------------------------------------------------------------ Ct Abdomen Pelvis W Contrast  08/12/2013   CLINICAL DATA:  Bilateral lower abdominal pain with nausea and vomiting. Evaluate for intra-abdominal process/colitis. History of hysterectomy.  EXAM: CT ABDOMEN AND PELVIS WITH CONTRAST  TECHNIQUE: Multidetector CT imaging of the abdomen and pelvis was performed using the standard protocol following bolus administration of intravenous contrast.  CONTRAST:  10/10/2013 OMNIPAQUE IOHEXOL 300 MG/ML  SOLN  COMPARISON:  Pelvic ultrasound 12/26/2006.  FINDINGS: The visualized lung bases are clear. There is no significant pleural or pericardial effusion.  The liver, spleen and gallbladder demonstrate no significant findings. There is fatty replacement of the pancreatic head with mild atrophy. The pancreatic tail appears prominent relative to the pancreatic body, measuring approximately 3.6 x 2.5 cm on image 24 of series 2. No well-defined mass or surrounding inflammatory change is  apparent. Adjacent nodularity may reflect accessory splenic tissue. The splenic vein appears normal. There is no biliary or pancreatic ductal dilatation.  The adrenal glands and left kidney appear normal. In the interpolar region of the right kidney is an ill-defined 9 mm low-density lesion, best seen on image 21 of series 5. There is no hydronephrosis.  The stomach is decompressed. The small bowel, colon and appendix appear normal. There is no evidence of colitis or surrounding inflammation. A small umbilical hernia containing only fat is noted. There  is no significant atherosclerosis. No enlarged abdominal pelvic lymph nodes are identified.  The uterus is surgically absent. There is no adnexal mass. The bladder appears normal.  There are no worrisome osseous findings.  IMPRESSION: 1. No acute pelvic findings, evidence of colitis or explanation for the patient's symptoms identified. 2. Relative prominence of the pancreatic tail could be secondary to atrophy of the pancreatic body and head, although an ill-defined mass is difficult to exclude. There is no surrounding inflammation. Further evaluation with MRCP with and without contrast recommended for further evaluation. 3. Small right renal lesion, too small to characterize. 4. These results will be called to the ordering clinician or representative by the Radiologist Assistant, and communication documented in the PACS Dashboard.   Electronically Signed   By: Roxy Horseman M.D.   On: 08/12/2013 12:58   Noted CT findings above.  Likely viral gastroenteritis, though will treat with short course of cipro and flagyl pending stool studies.  Follow up with GI given ? Pancreatic findings on imaging.  Discussed GI red flags at length. Pt expressed understanding.  Follow up as needed.      Doree Albee, MD 08/12/13 1311

## 2013-08-12 NOTE — ED Notes (Signed)
Vomiting and diarrhea, headache, body aches, slight fever, chills exhaustion since yesterday

## 2013-08-13 LAB — COMPREHENSIVE METABOLIC PANEL
ALK PHOS: 110 U/L (ref 39–117)
ALT: 10 U/L (ref 0–35)
AST: 14 U/L (ref 0–37)
Albumin: 4.2 g/dL (ref 3.5–5.2)
BILIRUBIN TOTAL: 0.5 mg/dL (ref 0.2–1.2)
BUN: 8 mg/dL (ref 6–23)
CO2: 29 mEq/L (ref 19–32)
CREATININE: 0.65 mg/dL (ref 0.50–1.10)
Calcium: 9.1 mg/dL (ref 8.4–10.5)
Chloride: 103 mEq/L (ref 96–112)
Glucose, Bld: 92 mg/dL (ref 70–99)
Potassium: 3.5 mEq/L (ref 3.5–5.3)
SODIUM: 148 meq/L — AB (ref 135–145)
TOTAL PROTEIN: 6.8 g/dL (ref 6.0–8.3)

## 2013-08-13 LAB — LIPASE

## 2013-08-17 LAB — OVA AND PARASITE EXAMINATION: OP: NONE SEEN

## 2013-08-18 LAB — CLOSTRIDIUM DIFFICILE BY PCR: Toxigenic C. Difficile by PCR: NOT DETECTED

## 2013-08-19 LAB — FECAL LACTOFERRIN, QUANT: Lactoferrin: NEGATIVE

## 2013-08-20 LAB — STOOL CULTURE

## 2013-08-25 ENCOUNTER — Telehealth: Payer: Self-pay | Admitting: *Deleted

## 2013-11-10 ENCOUNTER — Telehealth: Payer: Self-pay

## 2013-11-10 DIAGNOSIS — M25519 Pain in unspecified shoulder: Secondary | ICD-10-CM

## 2013-11-10 NOTE — Telephone Encounter (Signed)
Happy to, referral placed.

## 2013-11-10 NOTE — Telephone Encounter (Signed)
Patient called stated that she has had surgery on her shoulder and she wants to know if she can get a referral to start PT again. Jazline Cumbee,CMA

## 2013-11-11 NOTE — Telephone Encounter (Signed)
Left message on patient hvm advising her that PT order has been done. Rhonda Cunningham,CMA

## 2013-11-19 ENCOUNTER — Ambulatory Visit: Payer: 59 | Admitting: Physical Therapy

## 2013-12-03 ENCOUNTER — Other Ambulatory Visit: Payer: Self-pay | Admitting: *Deleted

## 2013-12-03 DIAGNOSIS — A084 Viral intestinal infection, unspecified: Secondary | ICD-10-CM

## 2013-12-03 MED ORDER — DIPHENOXYLATE-ATROPINE 2.5-0.025 MG PO TABS: ORAL_TABLET | ORAL | Status: DC

## 2014-03-17 ENCOUNTER — Ambulatory Visit: Payer: 59 | Admitting: Family Medicine

## 2014-03-23 ENCOUNTER — Encounter: Payer: Self-pay | Admitting: Physician Assistant

## 2014-03-23 ENCOUNTER — Ambulatory Visit (INDEPENDENT_AMBULATORY_CARE_PROVIDER_SITE_OTHER): Payer: 59 | Admitting: Physician Assistant

## 2014-03-23 VITALS — BP 142/79 | HR 96 | Ht 66.0 in | Wt 195.0 lb

## 2014-03-23 DIAGNOSIS — A084 Viral intestinal infection, unspecified: Secondary | ICD-10-CM

## 2014-03-23 DIAGNOSIS — Q453 Other congenital malformations of pancreas and pancreatic duct: Secondary | ICD-10-CM

## 2014-03-23 DIAGNOSIS — A088 Other specified intestinal infections: Secondary | ICD-10-CM

## 2014-03-23 MED ORDER — DIPHENOXYLATE-ATROPINE 2.5-0.025 MG PO TABS: ORAL_TABLET | ORAL | Status: DC

## 2014-03-23 MED ORDER — TRAMADOL HCL 50 MG PO TABS: 50.0000 mg | ORAL_TABLET | Freq: Four times a day (QID) | ORAL | Status: DC | PRN

## 2014-03-23 MED ORDER — PROMETHAZINE HCL 25 MG PO TABS: 25.0000 mg | ORAL_TABLET | Freq: Four times a day (QID) | ORAL | Status: DC | PRN

## 2014-03-23 MED ORDER — ZOLPIDEM TARTRATE 10 MG PO TABS: 10.0000 mg | ORAL_TABLET | Freq: Every evening | ORAL | Status: DC | PRN

## 2014-03-23 MED ORDER — FLUTICASONE PROPIONATE 50 MCG/ACT NA SUSP: 2.0000 | Freq: Every day | NASAL | Status: DC

## 2014-03-23 MED ORDER — HYDROCHLOROTHIAZIDE 12.5 MG PO CAPS: 12.5000 mg | ORAL_CAPSULE | Freq: Every day | ORAL | Status: DC

## 2014-03-23 NOTE — Patient Instructions (Addendum)
Will make referral.   Food Choices to Help Relieve Diarrhea When you have diarrhea, the foods you eat and your eating habits are very important. Choosing the right foods and drinks can help relieve diarrhea. Also, because diarrhea can last up to 7 days, you need to replace lost fluids and electrolytes (such as sodium, potassium, and chloride) in order to help prevent dehydration.  WHAT GENERAL GUIDELINES DO I NEED TO FOLLOW?  Slowly drink 1 cup (8 oz) of fluid for each episode of diarrhea. If you are getting enough fluid, your urine will be clear or pale yellow.  Eat starchy foods. Some good choices include white rice, white toast, pasta, low-fiber cereal, baked potatoes (without the skin), saltine crackers, and bagels.  Avoid large servings of any cooked vegetables.  Limit fruit to two servings per day. A serving is  cup or 1 small piece.  Choose foods with less than 2 g of fiber per serving.  Limit fats to less than 8 tsp (38 g) per day.  Avoid fried foods.  Eat foods that have probiotics in them. Probiotics can be found in certain dairy products.  Avoid foods and beverages that may increase the speed at which food moves through the stomach and intestines (gastrointestinal tract). Things to avoid include:  High-fiber foods, such as dried fruit, raw fruits and vegetables, nuts, seeds, and whole grain foods.  Spicy foods and high-fat foods.  Foods and beverages sweetened with high-fructose corn syrup, honey, or sugar alcohols such as xylitol, sorbitol, and mannitol. WHAT FOODS ARE RECOMMENDED? Grains White rice. White, [place], or pita breads (fresh or toasted), including plain rolls, buns, or bagels. White pasta. Saltine, soda, or graham crackers. Pretzels. Low-fiber cereal. Cooked cereals made with water (such as cornmeal, farina, or cream cereals). Plain muffins. Matzo. Melba toast. Zwieback.  Vegetables Potatoes (without the skin). Strained tomato and vegetable juices. Most  well-cooked and canned vegetables without seeds. Tender lettuce. Fruits Cooked or canned applesauce, apricots, cherries, fruit cocktail, grapefruit, peaches, pears, or plums. Fresh bananas, apples without skin, cherries, grapes, cantaloupe, grapefruit, peaches, oranges, or plums.  Meat and Other Protein Products Baked or boiled chicken. Eggs. Tofu. Fish. Seafood. Smooth peanut butter. Ground or well-cooked tender beef, ham, veal, lamb, pork, or poultry.  Dairy Plain yogurt, kefir, and unsweetened liquid yogurt. Lactose-free milk, buttermilk, or soy milk. Plain hard cheese. Beverages Sport drinks. Clear broths. Diluted fruit juices (except prune). Regular, caffeine-free sodas such as ginger ale. Water. Decaffeinated teas. Oral rehydration solutions. Sugar-free beverages not sweetened with sugar alcohols. Other Bouillon, broth, or soups made from recommended foods.  The items listed above may not be a complete list of recommended foods or beverages. Contact your dietitian for more options. WHAT FOODS ARE NOT RECOMMENDED? Grains Whole grain, whole wheat, bran, or rye breads, rolls, pastas, crackers, and cereals. Wild or brown rice. Cereals that contain more than 2 g of fiber per serving. Corn tortillas or taco shells. Cooked or dry oatmeal. Granola. Popcorn. Vegetables Raw vegetables. Cabbage, broccoli, Brussels sprouts, artichokes, baked beans, beet greens, corn, kale, legumes, peas, sweet potatoes, and yams. Potato skins. Cooked spinach and cabbage. Fruits Dried fruit, including raisins and dates. Raw fruits. Stewed or dried prunes. Fresh apples with skin, apricots, mangoes, pears, raspberries, and strawberries.  Meat and Other Protein Products Chunky peanut butter. Nuts and seeds. Beans and lentils. Tomasa Blase.  Dairy High-fat cheeses. Milk, chocolate milk, and beverages made with milk, such as milk shakes. Cream. Ice cream. Sweets and Desserts Sweet rolls,  doughnuts, and sweet breads. Pancakes  and waffles. Fats and Oils Butter. Cream sauces. Margarine. Salad oils. Plain salad dressings. Olives. Avocados.  Beverages Caffeinated beverages (such as coffee, tea, soda, or energy drinks). Alcoholic beverages. Fruit juices with pulp. Prune juice. Soft drinks sweetened with high-fructose corn syrup or sugar alcohols. Other Coconut. Hot sauce. Chili powder. Mayonnaise. Gravy. Cream-based or milk-based soups.  The items listed above may not be a complete list of foods and beverages to avoid. Contact your dietitian for more information. WHAT SHOULD I DO IF I BECOME DEHYDRATED? Diarrhea can sometimes lead to dehydration. Signs of dehydration include dark urine and dry mouth and skin. If you think you are dehydrated, you should rehydrate with an oral rehydration solution. These solutions can be purchased at pharmacies, retail stores, or online.  Drink -1 cup (120-240 mL) of oral rehydration solution each time you have an episode of diarrhea. If drinking this amount makes your diarrhea worse, try drinking smaller amounts more often. For example, drink 1-3 tsp (5-15 mL) every 5-10 minutes.  A general rule for staying hydrated is to drink 1-2 L of fluid per day. Talk to your health care provider about the specific amount you should be drinking each day. Drink enough fluids to keep your urine clear or pale yellow. Document Released: 09/07/2003 Document Revised: 06/22/2013 Document Reviewed: 05/10/2013 [facility] Patient Information 2015 [place], [place]. This information is not intended to replace advice given to you by your health care provider. Make sure you discuss any questions you have with your health care provider.

## 2014-03-23 NOTE — Progress Notes (Signed)
   Subjective:    Patient ID: Wanda Ortiz, female    DOB: 1960/10/19, 53 y.o.   MRN: [id number]  HPI Pt presents to the clinic with nausea and diarrhea for last week. Today she has had 3 loose stools. No blood in stool. She is nauseated but not vomiting. Some abdominal tenderness but no pain. Taking pepto bismol. No recent abx. No recent travel. No fever. No new food exposure. Works in a daycare.   Episode of similar symptoms in february. CT was done and found pancreatic abnormality. She has not had evaluated.     Review of Systems  All other systems reviewed and are negative.      Objective:   Physical Exam  Constitutional: She is oriented to person, place, and time. She appears well-developed and well-nourished.  HENT:  Head: Normocephalic and atraumatic.  Cardiovascular: Normal rate, regular rhythm and normal heart sounds.   Pulmonary/Chest: Effort normal and breath sounds normal.  No CVA tenderness.   Abdominal: Soft. Bowel sounds are normal. She exhibits no distension and no mass. There is tenderness. There is no rebound and no guarding.  Generalized tenderness. No guarding or rebound.   Neurological: She is alert and oriented to person, place, and time.  Skin: Skin is dry.  Psychiatric: She has a normal mood and affect. Her behavior is normal.          Assessment & Plan:  Gastroenteritis- likely viral. Pt work in a Audiological scientist. No acute abdomen on exam today. No risk factors for c.diff or food poisoning. Continue lomotil, BRAT diet, hydration. Written out of work for 1 day. Follow up if symptoms worsening or not improving and can get stool cultures. Phenergan given for nausea.   Pancreatic abnormality-CT scan showed abnormality on CT.  pt has not had evaluated. Will refer to GI for evaluation.

## 2014-03-25 ENCOUNTER — Telehealth: Payer: Self-pay | Admitting: *Deleted

## 2014-03-25 ENCOUNTER — Other Ambulatory Visit: Payer: Self-pay | Admitting: Family Medicine

## 2014-03-25 DIAGNOSIS — Z1231 Encounter for screening mammogram for malignant neoplasm of breast: Secondary | ICD-10-CM

## 2014-03-25 NOTE — Telephone Encounter (Signed)
If still having diarrhea come and and give stool culture, C.diff to evaluate diarrhea. Ok for work note.

## 2014-03-25 NOTE — Telephone Encounter (Signed)
Pt calls stating that her stomach is still bothering her & she's running a low grade temp.  She wanted to know if she could have another work note for today.  She asked about coming in today for a flu shot but I advised her to wait until she's not running a temp.

## 2014-03-25 NOTE — Telephone Encounter (Signed)
Work note printed.  

## 2014-03-30 ENCOUNTER — Ambulatory Visit (INDEPENDENT_AMBULATORY_CARE_PROVIDER_SITE_OTHER): Payer: 59 | Admitting: Family Medicine

## 2014-03-30 ENCOUNTER — Ambulatory Visit (INDEPENDENT_AMBULATORY_CARE_PROVIDER_SITE_OTHER): Payer: 59

## 2014-03-30 VITALS — Temp 98.0°F

## 2014-03-30 DIAGNOSIS — Z1231 Encounter for screening mammogram for malignant neoplasm of breast: Secondary | ICD-10-CM

## 2014-03-30 DIAGNOSIS — Z23 Encounter for immunization: Secondary | ICD-10-CM

## 2014-03-30 NOTE — Progress Notes (Signed)
   Subjective:    Patient ID: Wanda Ortiz, female    DOB: 28-Apr-1961, 53 y.o.   MRN: [id number]  HPI Here for Flu vaccination.   Review of Systems     Objective:   Physical Exam        Assessment & Plan:  Tolerated injection with complications.

## 2014-03-31 ENCOUNTER — Other Ambulatory Visit: Payer: Self-pay | Admitting: Gastroenterology

## 2014-03-31 ENCOUNTER — Ambulatory Visit (INDEPENDENT_AMBULATORY_CARE_PROVIDER_SITE_OTHER): Payer: 59

## 2014-03-31 DIAGNOSIS — R932 Abnormal findings on diagnostic imaging of liver and biliary tract: Secondary | ICD-10-CM

## 2014-03-31 DIAGNOSIS — K760 Fatty (change of) liver, not elsewhere classified: Secondary | ICD-10-CM

## 2014-03-31 MED ORDER — IOHEXOL 300 MG/ML  SOLN
125.0000 mL | Freq: Once | INTRAMUSCULAR | Status: AC | PRN
  Administered 2014-03-31: 125 mL via INTRAVENOUS

## 2014-08-09 ENCOUNTER — Ambulatory Visit (INDEPENDENT_AMBULATORY_CARE_PROVIDER_SITE_OTHER): Payer: 59 | Admitting: Family Medicine

## 2014-08-09 ENCOUNTER — Encounter: Payer: Self-pay | Admitting: Family Medicine

## 2014-08-09 VITALS — BP 154/91 | HR 97 | Temp 97.8°F | Wt 192.0 lb

## 2014-08-09 DIAGNOSIS — J209 Acute bronchitis, unspecified: Secondary | ICD-10-CM

## 2014-08-09 MED ORDER — HYDROCODONE-HOMATROPINE 5-1.5 MG/5ML PO SYRP: 5.0000 mL | ORAL_SOLUTION | Freq: Every evening | ORAL | Status: DC | PRN

## 2014-08-09 NOTE — Progress Notes (Signed)
   Subjective:    Patient ID: Wanda Ortiz, female    DOB: 01/09/1961, 54 y.o.   MRN: [id number]  HPI Cough x 4 days with yellow mucous. She's also had a runny nose and some mild congestion but has not been severe. No facial pain or pressure. No sore throat. No GI upset or diarrhea. She did try Robitussin without any relief and then tried Alka-Seltzer cold and cough which helped maybe a little. She started to notice some increase in thick sputum with her cough over the last 24 hours.  Review of Systems     Objective:   Physical Exam  Constitutional: She is oriented to person, place, and time. She appears well-developed and well-nourished.  HENT:  Head: Normocephalic and atraumatic.  Right Ear: External ear normal.  Left Ear: External ear normal.  Nose: Nose normal.  Mouth/Throat: Oropharynx is clear and moist.  TMs and canals are clear.   Eyes: Conjunctivae and EOM are normal. Pupils are equal, round, and reactive to light.  Neck: Neck supple. No thyromegaly present.  Cardiovascular: Normal rate, regular rhythm and normal heart sounds.   Pulmonary/Chest: Effort normal and breath sounds normal. She has no wheezes.  Lymphadenopathy:    She has no cervical adenopathy.  Neurological: She is alert and oriented to person, place, and time.  Skin: Skin is warm and dry.  Psychiatric: She has a normal mood and affect.          Assessment & Plan:  Early bronchitis - suspect early bronchitis. Lung exam is actually clear but her predominant symptom is cough. Will give her a perception cough medicine for bedtime. Expired her most likely viral and will typically last and 14 days. If suddenly gets worse or not improving in the next week then please let me know and we will consider other treatment options.

## 2014-08-17 ENCOUNTER — Telehealth: Payer: Self-pay

## 2014-08-17 MED ORDER — AZITHROMYCIN 250 MG PO TABS: ORAL_TABLET | ORAL | Status: DC

## 2014-08-17 NOTE — Telephone Encounter (Signed)
ABX sent to [facility]. If not better in one week then needs OV

## 2014-08-17 NOTE — Telephone Encounter (Signed)
Wanda Ortiz still has a cough with yellow sputum. She has improved only slightly. Denies fever, chills or sweats. She was advised to call back if symptoms persisted. Please advise.

## 2014-08-18 NOTE — Telephone Encounter (Signed)
Left detailed message.   

## 2015-03-22 ENCOUNTER — Encounter: Payer: Self-pay | Admitting: Osteopathic Medicine

## 2015-03-22 ENCOUNTER — Ambulatory Visit (INDEPENDENT_AMBULATORY_CARE_PROVIDER_SITE_OTHER): Payer: 59 | Admitting: Osteopathic Medicine

## 2015-03-22 VITALS — BP 154/94 | HR 60 | Temp 98.4°F | Wt 196.0 lb

## 2015-03-22 DIAGNOSIS — E785 Hyperlipidemia, unspecified: Secondary | ICD-10-CM | POA: Diagnosis not present

## 2015-03-22 DIAGNOSIS — Z209 Contact with and (suspected) exposure to unspecified communicable disease: Secondary | ICD-10-CM

## 2015-03-22 DIAGNOSIS — I10 Essential (primary) hypertension: Secondary | ICD-10-CM | POA: Diagnosis not present

## 2015-03-22 LAB — CBC WITH DIFFERENTIAL/PLATELET
BASOS PCT: 0 % (ref 0–1)
Basophils Absolute: 0 10*3/uL (ref 0.0–0.1)
EOS PCT: 3 % (ref 0–5)
Eosinophils Absolute: 0.2 10*3/uL (ref 0.0–0.7)
HEMATOCRIT: 41.2 % (ref 36.0–46.0)
Hemoglobin: 13.7 g/dL (ref 12.0–15.0)
Lymphocytes Relative: 42 % (ref 12–46)
Lymphs Abs: 2.4 10*3/uL (ref 0.7–4.0)
MCH: 28.5 pg (ref 26.0–34.0)
MCHC: 33.3 g/dL (ref 30.0–36.0)
MCV: 85.7 fL (ref 78.0–100.0)
MONO ABS: 0.3 10*3/uL (ref 0.1–1.0)
MONOS PCT: 5 % (ref 3–12)
MPV: 9.7 fL (ref 8.6–12.4)
NEUTROS ABS: 2.8 10*3/uL (ref 1.7–7.7)
Neutrophils Relative %: 50 % (ref 43–77)
Platelets: 193 10*3/uL (ref 150–400)
RBC: 4.81 MIL/uL (ref 3.87–5.11)
RDW: 14 % (ref 11.5–15.5)
WBC: 5.6 10*3/uL (ref 4.0–10.5)

## 2015-03-22 LAB — COMPLETE METABOLIC PANEL WITH GFR
ALT: 11 U/L (ref 6–29)
AST: 15 U/L (ref 10–35)
Albumin: 3.9 g/dL (ref 3.6–5.1)
Alkaline Phosphatase: 102 U/L (ref 33–130)
BUN: 11 mg/dL (ref 7–25)
CALCIUM: 8.8 mg/dL (ref 8.6–10.4)
CO2: 28 mmol/L (ref 20–31)
Chloride: 109 mmol/L (ref 98–110)
Creat: 0.72 mg/dL (ref 0.50–1.05)
GFR, Est African American: >89 mL/min (ref >=60)
Glucose, Bld: 72 mg/dL (ref 65–99)
POTASSIUM: 3.8 mmol/L (ref 3.5–5.3)
SODIUM: 146 mmol/L (ref 135–146)
Total Bilirubin: 0.4 mg/dL (ref 0.2–1.2)
Total Protein: 6.3 g/dL (ref 6.1–8.1)

## 2015-03-22 LAB — LIPID PANEL
CHOL/HDL RATIO: 5.3 ratio — AB (ref <=5.0)
CHOLESTEROL: 227 mg/dL — AB (ref 125–200)
HDL: 43 mg/dL — AB (ref >=46)
LDL Cholesterol: 125 mg/dL (ref <130)
Triglycerides: 293 mg/dL — ABNORMAL HIGH (ref <150)
VLDL: 59 mg/dL — ABNORMAL HIGH (ref <30)

## 2015-03-22 MED ORDER — HYDROCHLOROTHIAZIDE 12.5 MG PO CAPS: 12.5000 mg | ORAL_CAPSULE | Freq: Every day | ORAL | Status: DC

## 2015-03-22 NOTE — Progress Notes (Signed)
HPI: Wanda Ortiz is a 54 y.o. female who presents to [facility]  today for chief complaint of:  Chief Complaint  Patient presents with  . 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 States that she needs testing for Escherichia coli, one of the students recently tested positive and now she needs to be tested negative before returning to work. Patient denies symptoms of abdominal pain and diarrhea at this time. I spoke to [employer], RN at [facility] [employer], patient had this person's card. Ms. Charlestine Night stated that the patient previously reported diarrhea and therefore would need testing to return to work. Recommend stool culture.   Additionally, patient blood pressure elevated today. Review of records blood pressure elevated at previous visits. Patient has been on hydrochlorothiazide in the past, no longer taking this medicine, cannot recall adverse reaction to this or abnormal blood work as a reason for stopping it. No chest pain or difficulty breathin Forg.     Past medical, social and family history reviewed: Past Medical History  Diagnosis Date  . AR (allergic rhinitis)   . Migraines   . Hypertension   . Hyperlipemia    Past Surgical History  Procedure Laterality Date  . Abdominal hysterectomy      partial with BSO for Brenners Tumor   Social History  Substance Use Topics  . Smoking status: Current Every Day Smoker -- 0.30 packs/day for 15 years    Types: Cigarettes  . Smokeless tobacco: Not on file  . Alcohol Use: 1.0 oz/week    2 drink(s) per week   Family History  Problem Relation Age of Onset  . Diabetes Mother   . Hyperlipidemia Mother   . Hypertension Father   . Cancer Father     prostate    Current Outpatient Prescriptions  Medication Sig Dispense Refill  . cetirizine-pseudoephedrine (ZYRTEC-D) 5-120 MG per tablet Take 1 tablet by mouth daily  as needed.     . fluticasone (FLONASE) 50 MCG/ACT nasal spray Place 2 sprays into both nostrils daily. 16 g 3  . naratriptan (AMERGE) 2.5 MG tablet Take 2.5 mg by mouth as needed. Take one (1) tablet at onset of headache; if returns or does not resolve, may repeat after 4 hours; do not exceed five (5) mg in 24 hours.     Marland Kitchen zolpidem (AMBIEN) 10 MG tablet Take 1 tablet (10 mg total) by mouth at bedtime as needed. For sleep 30 tablet 0  .   30 capsule 1   No current facility-administered medications for this visit.   No Known Allergies    Review of Systems: CONSTITUTIONAL: Neg fever/chills, no unintentional weight changes CARDIAC: No chest pain/pressure/palpitations, no orthopnea RESPIRATORY: No cough/shortness of breath/wheeze GASTROINTESTINAL: No nausea/vomiting/abdominal pain/blood in stool/diarrhea/constipation MUSCULOSKELETAL: No myalgia/arthralgia GENITOURINARY: No incontinence, No abnormal genital bleeding/discharge   Exam:  BP 154/94 mmHg  Pulse 60  Temp(Src) 98.4 F (36.9 C) (Oral)  Wt 196 lb (88.905 kg)  SpO2 99% Constitutional: VSS, see above. General Appearance: alert, well-developed, well-nourished, NAD Respiratory: Normal respiratory effort. no wheeze/rhonchi/rales Cardiovascular: S1/S2 normal, no murmur/rub/gallop auscultated. RRR.  Gastrointestinal: Nontender, no masses. No hepatomegaly, no splenomegaly. No hernia appreciated. Bowel sounds normal. Rectal exam deferred.   No results found for this or any previous visit (from the past 72 hour(s)).    ASSESSMENT/PLAN:  Exposure to potential infection - Plan: Stool culture  Essential  hypertension - Plan: hydrochlorothiazide (MICROZIDE) 12.5 MG capsule, CBC with Differential/Platelet, COMPLETE METABOLIC PANEL WITH GFR, Lipid panel   Patient encouraged to follow-up with Dr. Glade Lloyd to monitor blood pressure and address preventative care. We'll get labs today to ensure medication safety, patient also due for lipid  screening and diabetes screening.

## 2015-03-23 ENCOUNTER — Telehealth: Payer: Self-pay

## 2015-03-23 NOTE — Telephone Encounter (Signed)
We need to fax the stool culture results as soon as they are available. Fax results to [phone] Attention Daria Pastures.

## 2015-03-24 DIAGNOSIS — E785 Hyperlipidemia, unspecified: Secondary | ICD-10-CM | POA: Insufficient documentation

## 2015-03-24 MED ORDER — PRAVASTATIN SODIUM 40 MG PO TABS: 40.0000 mg | ORAL_TABLET | Freq: Every day | ORAL | Status: DC

## 2015-03-24 NOTE — Telephone Encounter (Signed)
Patient called in for results on stool culture. No final results available @ this time.   Did inform her about the cholesterol & medication sent to pharmacy.

## 2015-03-24 NOTE — Addendum Note (Signed)
Addended by: Deirdre Pippins on: 03/24/2015 11:50 AM   Modules accepted: Orders

## 2015-03-26 LAB — STOOL CULTURE

## 2015-03-27 NOTE — Telephone Encounter (Signed)
Faxed results this morning.

## 2015-06-02 ENCOUNTER — Ambulatory Visit (INDEPENDENT_AMBULATORY_CARE_PROVIDER_SITE_OTHER): Payer: 59 | Admitting: Family Medicine

## 2015-06-02 VITALS — Temp 98.2°F

## 2015-06-02 DIAGNOSIS — Z23 Encounter for immunization: Secondary | ICD-10-CM

## 2015-12-08 ENCOUNTER — Ambulatory Visit: Payer: 59 | Admitting: Family Medicine

## 2015-12-12 ENCOUNTER — Ambulatory Visit (INDEPENDENT_AMBULATORY_CARE_PROVIDER_SITE_OTHER): Payer: 59 | Admitting: Family Medicine

## 2015-12-12 ENCOUNTER — Encounter: Payer: Self-pay | Admitting: Family Medicine

## 2015-12-12 VITALS — BP 142/81 | HR 93 | Temp 98.6°F | Wt 193.0 lb

## 2015-12-12 DIAGNOSIS — I10 Essential (primary) hypertension: Secondary | ICD-10-CM

## 2015-12-12 DIAGNOSIS — K047 Periapical abscess without sinus: Secondary | ICD-10-CM

## 2015-12-12 MED ORDER — AMOXICILLIN-POT CLAVULANATE 875-125 MG PO TABS: 1.0000 | ORAL_TABLET | Freq: Two times a day (BID) | ORAL | Status: DC

## 2015-12-12 MED ORDER — HYDROCHLOROTHIAZIDE 12.5 MG PO CAPS: 12.5000 mg | ORAL_CAPSULE | Freq: Every day | ORAL | Status: DC

## 2015-12-12 MED ORDER — NARATRIPTAN HCL 2.5 MG PO TABS: 2.5000 mg | ORAL_TABLET | ORAL | Status: DC | PRN

## 2015-12-12 NOTE — Progress Notes (Signed)
Subjective:    CC: Dental pain.   HPI:  She comes in today with swelling of her gumline on the right lower jaw. She said she felt a small bump there about a week and half ago and actually went to her dentist. They did schedule her a follow-up appointment. She says there was a crack in the tooth which has a filling in it. She says it's been getting larger over the last couple of days. She says she's been trying to swish with Listerine to try to help it. She has not noticed any discharge or abnormal taste in her mouth. No fevers chills or sweats. She says it's actually really not that painful.  Hypertension- Pt denies chest pain, SOB, dizziness, or heart palpitations.  Taking meds as directed w/o problems.  Denies medication side effects.     Past medical history, Surgical history, Family history not pertinant except as noted below, Social history, Allergies, and medications have been entered into the medical record, reviewed, and corrections made.   Review of Systems: No fevers, chills, night sweats, weight loss, chest pain, or shortness of breath.   Objective:    General: Well Developed, well nourished, and in no acute distress.  Neuro: Alert and oriented x3, extra-ocular muscles intact, sensation grossly intact.  HEENT: Normocephalic, atraumatic. On the right lower jaw she ha a very swollen approx 3 cm round area.  Tooth with a filling.  No active drainage.   Skin: Warm and dry, no rashes. Cardiac: Regular rate and rhythm, no murmurs rubs or gallops, no lower extremity edema.  Respiratory: Clear to auscultation bilaterally. Not using accessory muscles, speaking in full sentences.   Impression and Recommendations:    Dental abscess-most likely dental abscess. We'll go ahead and place her on Augmentin. Did encourage her to call her dental office and get back in as soon as possible. She will likely need to have the tooth extracted or the area drained. Call if getting worse, if the area is  getting larger, or she develops fevers chills or sweats.  HTN - Uncontrolled.  Restart HCTZ.  Did encourage her to follow up in 1-2 months that we can recheck her blood pressure. She is also due for blood work. She says she'll plan to schedule a physical.

## 2015-12-13 ENCOUNTER — Telehealth: Payer: Self-pay | Admitting: *Deleted

## 2015-12-13 NOTE — Telephone Encounter (Signed)
Ov notes faxed to pt's dentist office.Wanda Ortiz

## 2016-01-25 ENCOUNTER — Encounter: Payer: 59 | Admitting: Family Medicine

## 2016-02-05 ENCOUNTER — Ambulatory Visit (INDEPENDENT_AMBULATORY_CARE_PROVIDER_SITE_OTHER): Payer: 59 | Admitting: Family Medicine

## 2016-02-05 ENCOUNTER — Encounter: Payer: Self-pay | Admitting: Family Medicine

## 2016-02-05 VITALS — BP 140/80 | HR 83 | Wt 195.0 lb

## 2016-02-05 DIAGNOSIS — M7052 Other bursitis of knee, left knee: Secondary | ICD-10-CM

## 2016-02-05 DIAGNOSIS — M7062 Trochanteric bursitis, left hip: Secondary | ICD-10-CM

## 2016-02-05 NOTE — Patient Instructions (Signed)
Call if not improving or getting worse over the next 3 weeks Increase Aleve to 2 tabs every 12 hours for inflammation and pain relief. Can add 2 extra strength Tylenol on top of this if needed for pain control. Work on icing the areas as needed.

## 2016-02-05 NOTE — Progress Notes (Signed)
Subjective:    CC: left knee pain  HPI: C/O left knee pain. Has had this before.  She said she's had flares with her knee before but this time it has been worse. It started flaring about a month and a half ago and has continued to bother her ever since. She even went to the emergency department [facility] and had an x-ray done because it was swelling. She was told that she had arthritis and to follow-up with her primary care if it wasn't improving. She says it still continues to swell. Most of her pain is medial. She has taken some Aleve. He says she's now also getting pain over the left outer hip and towards her buttock area that's radiating down her thigh. Again no recent trauma or injury. That she does remember hurting her left knee years ago. It did not require any surgical intervention at that time.  Past medical history, Surgical history, Family history not pertinant except as noted below, Social history, Allergies, and medications have been entered into the medical record, reviewed, and corrections made.   Review of Systems: No fevers, chills, night sweats, weight loss, chest pain, or shortness of breath.   Objective:    General: Well Developed, well nourished, and in no acute distress.  Neuro: Alert and oriented x3, extra-ocular muscles intact, sensation grossly intact.  HEENT: Normocephalic, atraumatic  Skin: Warm and dry, no rashes. Cardiac: Regular rate and rhythm, no murmurs rubs or gallops, no lower extremity edema.  Ext: Left knee with normal flexion extension. No increased laxity with anterior posterior drawer. Negative McMurray's. She is tender just below the medial joint line near the pes anserine bursa. She is also mildly tender over the left trochanteric bursa and just posteriorly. Negative straight leg raise. Hip, knee, ankle strength is 5 out of 5 bilaterally.    Impression and Recommendations:   Pes anserine bursitis, left knee-discussed treatment options.  Recommend stretches, exercises, icing, anti-inflammatory.  The trochanteric bursitis-continue conservative therapy as above. Handout given for specific exercises as well. Call if not improving or getting worse over the next 3 weeks. I do think she may also have some element of inflammation sciatic nerve coming from the piriformis muscle. Continue keeping an eye on this as well.

## 2016-02-26 ENCOUNTER — Ambulatory Visit (INDEPENDENT_AMBULATORY_CARE_PROVIDER_SITE_OTHER): Payer: 59 | Admitting: Osteopathic Medicine

## 2016-02-26 ENCOUNTER — Encounter: Payer: Self-pay | Admitting: Osteopathic Medicine

## 2016-02-26 VITALS — BP 143/90 | HR 94 | Temp 98.5°F | Ht 66.0 in | Wt 194.0 lb

## 2016-02-26 DIAGNOSIS — I10 Essential (primary) hypertension: Secondary | ICD-10-CM | POA: Diagnosis not present

## 2016-02-26 DIAGNOSIS — J069 Acute upper respiratory infection, unspecified: Secondary | ICD-10-CM

## 2016-02-26 DIAGNOSIS — B9789 Other viral agents as the cause of diseases classified elsewhere: Principal | ICD-10-CM

## 2016-02-26 MED ORDER — GUAIFENESIN-CODEINE 100-10 MG/5ML PO SYRP: 5.0000 mL | ORAL_SOLUTION | Freq: Three times a day (TID) | ORAL | 0 refills | Status: DC | PRN

## 2016-02-26 MED ORDER — IPRATROPIUM BROMIDE 0.03 % NA SOLN: 2.0000 | Freq: Three times a day (TID) | NASAL | 0 refills | Status: DC | PRN

## 2016-02-26 NOTE — Progress Notes (Signed)
HPI: Wanda Ortiz is a 55 y.o. female  who presents to [facility] today, 02/26/16,  for chief complaint of:  Chief Complaint  Patient presents with  . Cough  . Diarrhea   . Quality: running nose and bad cough, "deep cough"   . Duration: 2 days ago  . Modifying factors: took OTC DayQuil & NyQuil. On Lfonase and Zyrtec D . Assoc signs/symptoms: nausea, few loose BM few days ago, no appetite, temp 99 degrees.     Past medical, surgical, social and family history reviewed: Past Medical History:  Diagnosis Date  . AR (allergic rhinitis)   . Hyperlipemia   . Hypertension   . Migraines    Past Surgical History:  Procedure Laterality Date  . ABDOMINAL HYSTERECTOMY     partial with BSO for Brenners Tumor   Social History  Substance Use Topics  . Smoking status: Current Every Day Smoker    Packs/day: 0.30    Years: 15.00    Types: Cigarettes  . Smokeless tobacco: Not on file  . Alcohol use 1.0 oz/week    2 drink(s) per week   Family History  Problem Relation Age of Onset  . Diabetes Mother   . Hyperlipidemia Mother   . Hypertension Father   . Cancer Father     prostate     Current medication list and allergy/intolerance information reviewed:   Current Outpatient Prescriptions  Medication Sig Dispense Refill  . cetirizine-pseudoephedrine (ZYRTEC-D) 5-120 MG per tablet Take 1 tablet by mouth daily as needed.     . fluticasone (FLONASE) 50 MCG/ACT nasal spray Place 2 sprays into both nostrils daily. 16 g 3  . hydrochlorothiazide (MICROZIDE) 12.5 MG capsule Take 1 capsule (12.5 mg total) by mouth daily. 90 capsule 0  . naratriptan (AMERGE) 2.5 MG tablet Take 1 tablet (2.5 mg total) by mouth as needed. Take one (1) tablet at onset of headache; if returns or does not resolve, may repeat after 4 hours; do not exceed five (5) mg in 24 hours. 10 tablet 0  . pravastatin (PRAVACHOL) 40 MG tablet Take 1 tablet (40 mg total) by mouth daily.  90 tablet 3   No current facility-administered medications for this visit.    No Known Allergies    Review of Systems:  Constitutional:  No  fever, no chills, (+) recent illness, No unintentional weight changes. No significant fatigue.   HEENT: No  headache, no vision change, no hearing change, (+)sore throat, (+)sinus pressure  Cardiac: No  chest pain, No  Pressure  Respiratory:  No  shortness of breath. (+)Cough  Gastrointestinal: No  abdominal pain, (+)nausea, No  vomiting,  No  blood in stool, (+) resolved loose stool/diarrhea, No  constipation   Musculoskeletal: No new myalgia/arthralgia  Skin: No  Rash,   Exam:  BP (!) 143/90   Pulse 94   Temp 98.5 F (36.9 C) (Oral)   Ht 5\' 6"  (1.676 m)   Wt 194 lb (88 kg)   BMI 31.31 kg/m   Constitutional: VS see above. General Appearance: alert, well-developed, well-nourished, NAD  Eyes: Normal lids and conjunctive, non-icteric sclera  Ears, Nose, Mouth, Throat: MMM, Normal external inspection ears/nares/mouth/lips/gums. TM normal bilaterally. Pharynx/tonsils no erythema, no exudate. Nasal mucosa normal.   Neck: No masses, trachea midline. No thyroid enlargement. No tenderness/mass appreciated. No lymphadenopathy  Respiratory: Normal respiratory effort. no wheeze, no rhonchi, no rales  Cardiovascular: S1/S2 normal, no murmur, no rub/gallop auscultated. RRR.  ASSESSMENT/PLAN:   OTC meds reviewed - avoid double decongestants, see pt instructions  Work note given  Viral URI with cough - Plan: ipratropium (ATROVENT) 0.03 % nasal spray, guaiFENesin-codeine (ROBITUSSIN AC) 100-10 MG/5ML syrup  Essential hypertension - caution w/ decongestant use, F/U as directed w/ PCP     Visit summary with medication list and pertinent instructions was printed for patient to review. All questions at time of visit were answered - patient instructed to contact office with any additional concerns. ER/RTC precautions were reviewed with  the patient. Follow-up plan: Return if symptoms worsen or fail to improve 7-10 days after onser.

## 2016-02-26 NOTE — Patient Instructions (Signed)
DR. Rianne Ortiz'S HOME CARE INSTRUCTIONS: VIRAL ILLNESSES - ACHES/PAINS, SORE THROAT, SINUSITIS, COUGH, GASTRITIS   FIRST, A FEW NOTES ON OVER-THE-COUNTER MEDICATIONS!  USE CAUTION - MANY OVER-THE-COUNTER MEDICATIONS COME IN COMBINATIONS OF MULTIPLE GENERIC MEDICINES. FOR INSTANCE, NYQUIL HAS TYLENOL + COUGH MEDICINE + AN ANTIHISTAMINE, SO BE CAREFUL YOU'RE NOT TAKING A COMBINATION MEDICINE WHICH CONTAINS MEDICATIONS YOU'RE ALSO TAKING SEPARATELY (LIKE NYQUIL SYRUP PLUS TYLENOL PILLS).  YOUR PHARMACIST CAN HELP YOU AVOID MEDICATION INTERACTIONS AND DUPLICATIONS - ASK FOR THEIR HELP IF YOU ARE CONFUSED OR UNSURE ABOUT WHAT TO PURCHASE OVER-THE-COUNTER!  REMEMBER - IF YOU'RE EVER CONCERNED ABOUT MEDICATION SIDE EFFECTS, OR IF YOU'RE EVER CONCERNED YOUR SYMPTOMS ARE GETTING WORSE DESPITE TREATMENT, PLEASE CALL THE DOCTOR'S OFFICE! IF AFTER-HOURS, YOU CAN BE SEEN IN URGENT CARE OR, FOR SEVERE ILLNESS, PLEASE GO TO THE EMERGENCY ROOM.   TREAT SINUS CONGESTION, RUNNY NOSE & POSTNASAL DRIP: Treat to increase airflow through sinuses, decrease congestion pain and prevent bacterial growth!  Remember, only 0.5-2% of sinus infections are due to a bacteria, the rest are due to a virus (usually the common cold) which will not get better with antibiotics!  NASAL SPRAYS: generally safe and should not interact with other medicines. Can take any of these medications, either alone or together... FLONASE (FLUTICASONE) - 2 sprays in each nostril twice per day (also a great allergy medicine to use long-term!) AFRIN (OXYMETOLAZONE) - use sparingly because it will cause rebound congestion, NEVER USE IN KIDS  SALINE NASAL SPRAY- no limit, but avoid use after other nasal sprays or it can wash the medicine away PRESCRTIPTION ATROVENT - as directed on prescription bottle  ANTIHISTAMINES: Helps dry out runny nose and decreases postnasal drip. Benadryl may cause drowsiness but other preparations should not be as sedating.  Certain kinds are not as safe in elderly individuals. OK to use unless Dr A says otherwise.  Only use one of the following... BENADRYL (DIPHENHYDRAMINE) - 25-50 mg every 6 hours ZYRTEC (CETIRIZINE) - 5-10 mg daily CLARITIN (LORATIDINE) - less potent. 10 mg daily ALLEGRA (FEXOFENADINE) - least likely to cause drowsiness! 180 mg daily or 60 mg twice per day  DECONGESTANTS: Helps dry out runny nose and helps with sinus pain. May cause insomnia, or sometimes elevated heart rate. Can cause problems if used often in people with high blood pressure. OK to use unless Dr A says otherwise. NEVER USE IN KIDS UNDER 2 YEARS OLD. Only use one of the following... SUDAFED (PSEUDOEPHEDINE) - 60 mg every 4 - 6 hours, also comes in 120 mg extended release every 12 hours, maximum 240 mg in 24 hours.  SUDAED PE (PHENYLEPHRINE) - 10 mg every 4 - 6 hours, maximum 60 mg per day  COMBINATIONS OF ABOVE ANTIHISTAMINES + DECONGESTANTS: these usually require you to show your ID at the pharmacy counter. You can also purchase these medicines separately as noted above.  Only use one of the following... ZYRTEC-D (CETIRIZINE + PSEUDOEPHEDRINE) - 12 hour formulation as directed CLARITIN-D (LORATIDINE + PSEUDOEPHEDRINE) - 12 and 24 hour formulations as directed ALLEGRA-D (FEXOFENADINE + PSEUDOEPHEDRINE) - 12 and 24 hour formulations as directed  PRESCRIPTION TREATMENT FOR SINUS PROBLEMS: Can include nasal sprays, pills, or antibiotics in the case of true bacterial infection. Not everyone needs an antibiotic but there are other medicines which will help you feel better while your body fights the infection!    TREAT COUGH & SORE THROAT: Remember, cough is the body's way of protecting your airways and lungs, it's a hard-wired   reflex that is tough for medicines to treat!  Irritation to the airways will cause cough. This irritation is usually caused by upper airway problems like postnasal drip (treat as above) and viral sore  throat, but in severe cases can be due to lower airway problems like bronchitis or pneumonia, which a doctor can usually hear on exam of your lungs or see on an X-ray. Sore throat is almost always due to a virus, but occasionally caused by certain strains of Strep, which requires antibiotics.  Exercise and smoking may make cough worse - take it easy while you're sick, and QUIT SMOKING!  Cough due to viral infection can linger for 2-3 weeks or so. If you're coughing longer than you think you should, or if the cough is severe, please make an appointment in the office - you may need a chest X-ray.  EXPECTORANT: Used to help clear airways, take these with PLENTY of water to help thin mucus secretions and make the mucus easier to cough up  Only use one of the following... ROBITUSSIN (DEXTROMETHORPHAN OR GUAIFENISEN depending on formulation)  MUCINEX (GUAIFENICEN) - usually longer acting  COUGH DROPS/LOZENGES: Whichever over-the-counter agent you prefer!  Here are some suggestions for ingredients to look for (can take both)... BENZOCAINE - numbing effect, also in CHLORASEPTIC THROAT SPRAY MENTHOL - cooling effect  HONEY: has gone head-to-head in several clinical trials with prescription cough medicines and found to be equally effective! Try 1 Teaspoon Honey + 2 Drops Lemon Juice, as much as you want to use. NONE FOR KIDS UNDER AGE 2  HERBAL TEA: There are certain ingredients which help "coat the throat" to relieve pain  such as ELM BARK, LICORICE ROOT, MARSHMALLOW ROOT  PRESCRIPTION TREATMENT FOR COUGH: Reserved for severe cases. Can include pills, syrups or inhalers.    TREAT ACHES & PAINS, FEVER: Illness causes aches, pains and fever as your body increases its natural inflammation response to help fight the infection.  Rest, good hydration and nutrition, and taking anti-inflammatory medications will help.  Remember: a true fever is a temperature 100.4 or higher. If you have a fever that  is 105.0 or higher, that is a dangerous level and needs medical attention in the office or in the ER!  Can take both of these together if it's ok with your doctor... IBUPROFEN - 400-800 mg every 6 - 8 hours. Ibuprofen and similar medications can cause problems for people with heart disease or history of stomach ulcers, check with Dr A first if you're concerned. Lower doses are usually safe and effective.  TYLENOL (ACETAMINOPHEN) - 500-1000 mg every 6 hours. It won't cause problems with heart or stomach.     TREAT GASTROINTESTINAL SYMPTOMS: Hydrate, hydrate, hydrate! Try drinking Gatorade/Powerade, broth/soup. Avoid milk and juice, these can make diarrhea worse. You can drink water, of course, but if you are also having vomiting and loose stool you are losing electrolytes which water alone can't replace.  IMMODIUM (LOPERAMIDE) - as directed on the bottle to help with loose stool PRESCRIPTION ZOFRAN (ONDANSETRON) OR PHENERGAN (PROMETHAZINE) - as directed to help nausea and vomiting. Try taking these before you eat if you are having trouble keeping food down.     REMEMBER - THE MOST IMPORTANT THINGS YOU CAN DO TO AVOID CATCHING OR SPREADING ILLNESS INCLUDE:  COVER YOUR COUGH WITH YOUR ARM, NOT WITH YOUR HANDS!  DISINFECT COMMONLY USED SURFACES (SUCH AS TELEPHONES & DOORKNOBS) WHEN YOU OR SOMEONE CLOSE TO YOU IS FEELING SICK!  BE SURE   VACCINES ARE UP TO DATE - GET A FLU SHOT EVERY YEAR! THE FLU CAN BE DEADLY FOR BABIES, ELDERLY FOLKS, AND PEOPLE WITH WEAK IMMUNE SYSTEMS - YOU SHOULD BE VACCINATED TO HELP PREVENT YOURSELF FROM GETTING SICK, BUT ALSO TO PREVENT SOMEONE ELSE FROM GETTING AN INFECTION WHICH MAY HOSPITALIZE OR KILL THEM. GOOD NUTRITION AND HEALTHY LIFESTYLE WILL HELP YOUR IMMUNE SYSTEM YEAR-ROUND! THERE IS NO MAGIC SUPPLEMENT! AND ABOVE ALL - WASH YOUR HANDS OFTEN!  

## 2016-02-28 ENCOUNTER — Telehealth: Payer: Self-pay

## 2016-02-28 ENCOUNTER — Encounter: Payer: Self-pay | Admitting: Osteopathic Medicine

## 2016-02-28 NOTE — Telephone Encounter (Signed)
Patient called stated that she was seen on 02/26/16 for a cough and she stated that she was given a note for 3 days to be out of work however she is still not feeling better so she wants to know what she do as far as getting a note to stay out longer for her job. Please advise. Jayceon Troy,CMA

## 2016-02-28 NOTE — Telephone Encounter (Signed)
Needs to ask her employer if there is any other paperwork required - usually after 3 days, FMLA is involved so that's how I write my standard work notes. If she just needs another note, I can do that too.

## 2016-02-28 NOTE — Telephone Encounter (Signed)
Letter left at front desk

## 2016-02-28 NOTE — Telephone Encounter (Signed)
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 She just wants a letter to stay out for the rest of this week and that way she can continue to take her medication and just rest. Please advise patient will pick letter up in the morning. Rhonda Cunningham,CMA

## 2016-03-19 ENCOUNTER — Ambulatory Visit (INDEPENDENT_AMBULATORY_CARE_PROVIDER_SITE_OTHER): Payer: 59 | Admitting: Family Medicine

## 2016-03-19 ENCOUNTER — Other Ambulatory Visit: Payer: Self-pay | Admitting: Family Medicine

## 2016-03-19 ENCOUNTER — Encounter: Payer: Self-pay | Admitting: Family Medicine

## 2016-03-19 VITALS — BP 130/78 | HR 67 | Ht 66.0 in | Wt 195.0 lb

## 2016-03-19 DIAGNOSIS — I10 Essential (primary) hypertension: Secondary | ICD-10-CM

## 2016-03-19 DIAGNOSIS — Z0000 Encounter for general adult medical examination without abnormal findings: Secondary | ICD-10-CM

## 2016-03-19 DIAGNOSIS — Z1231 Encounter for screening mammogram for malignant neoplasm of breast: Secondary | ICD-10-CM

## 2016-03-19 DIAGNOSIS — Z114 Encounter for screening for human immunodeficiency virus [HIV]: Secondary | ICD-10-CM | POA: Diagnosis not present

## 2016-03-19 DIAGNOSIS — Z1159 Encounter for screening for other viral diseases: Secondary | ICD-10-CM

## 2016-03-19 DIAGNOSIS — Z23 Encounter for immunization: Secondary | ICD-10-CM | POA: Diagnosis not present

## 2016-03-19 DIAGNOSIS — E785 Hyperlipidemia, unspecified: Secondary | ICD-10-CM

## 2016-03-19 LAB — COMPLETE METABOLIC PANEL WITH GFR
ALT: 10 U/L (ref 6–29)
AST: 14 U/L (ref 10–35)
Albumin: 4 g/dL (ref 3.6–5.1)
Alkaline Phosphatase: 90 U/L (ref 33–130)
BILIRUBIN TOTAL: 0.4 mg/dL (ref 0.2–1.2)
BUN: 12 mg/dL (ref 7–25)
CHLORIDE: 110 mmol/L (ref 98–110)
CO2: 27 mmol/L (ref 20–31)
Calcium: 8.8 mg/dL (ref 8.6–10.4)
Creat: 0.82 mg/dL (ref 0.50–1.05)
GFR, Est Non African American: 81 mL/min (ref >=60)
GLUCOSE: 89 mg/dL (ref 65–99)
Potassium: 3.7 mmol/L (ref 3.5–5.3)
SODIUM: 146 mmol/L (ref 135–146)
TOTAL PROTEIN: 6.8 g/dL (ref 6.1–8.1)

## 2016-03-19 LAB — LIPID PANEL
Cholesterol: 260 mg/dL — ABNORMAL HIGH (ref 125–200)
HDL: 41 mg/dL — ABNORMAL LOW (ref >=46)
Total CHOL/HDL Ratio: 6.3 Ratio — ABNORMAL HIGH (ref <=5.0)
Triglycerides: 409 mg/dL — ABNORMAL HIGH (ref <150)

## 2016-03-19 LAB — TSH: TSH: 0.28 m[IU]/L — AB

## 2016-03-19 MED ORDER — PRAVASTATIN SODIUM 40 MG PO TABS: 40.0000 mg | ORAL_TABLET | Freq: Every day | ORAL | 3 refills | Status: DC

## 2016-03-19 MED ORDER — HYDROCHLOROTHIAZIDE 12.5 MG PO CAPS: 12.5000 mg | ORAL_CAPSULE | Freq: Every day | ORAL | 3 refills | Status: DC

## 2016-03-19 NOTE — Progress Notes (Signed)
   Subjective:     Wanda Ortiz is a 55 y.o. female and is here for a comprehensive physical exam. The patient reports no problems.  Social History   Social History  . Marital status: Single    Spouse name: N/A  . Number of children: N/A  . Years of education: N/A   Occupational History  . Not on file.   Social History Main Topics  . Smoking status: Current Every Day Smoker    Packs/day: 0.30    Years: 15.00    Types: Cigarettes  . Smokeless tobacco: Not on file  . Alcohol use 1.0 oz/week    2 drink(s) per week  . Drug use: No  . Sexual activity: Yes   Other Topics Concern  . Not on file   Social History Narrative  . No narrative on file   Health Maintenance  Topic Date Due  . Hepatitis C Screening  1960-09-28  . HIV Screening  03/19/1976  . PAP SMEAR  01/26/2011  . INFLUENZA VACCINE  01/30/2016  . MAMMOGRAM  03/30/2016  . COLONOSCOPY  08/06/2021  . TETANUS/TDAP  10/13/2021    The following portions of the patient's history were reviewed and updated as appropriate: allergies, current medications, past family history, past medical history, past social history, past surgical history and problem list.  Review of Systems A comprehensive review of systems was negative.   Objective:    BP (!) 161/90   Pulse 67   Ht 5\' 6"  (1.676 m)   Wt 195 lb (88.5 kg)   SpO2 100%   BMI 31.47 kg/m  General appearance: alert, cooperative and appears stated age Head: Normocephalic, without obvious abnormality, atraumatic Eyes: conj clear, EOMI, PEERLA Ears: normal TM's and external ear canals both ears Nose: Nares normal. Septum midline. Mucosa normal. No drainage or sinus tenderness. Throat: lips, mucosa, and tongue normal; teeth and gums normal Neck: no adenopathy, no carotid bruit, no JVD, supple, symmetrical, trachea midline and thyroid not enlarged, symmetric, no tenderness/mass/nodules Back: symmetric, no curvature. ROM normal. No CVA tenderness. Lungs: clear to  auscultation bilaterally Breasts: normal appearance, no masses or tenderness Heart: regular rate and rhythm, S1, S2 normal, no murmur, click, rub or gallop Abdomen: soft, non-tender; bowel sounds normal; no masses,  no organomegaly Pelvic: deferred Extremities: extremities normal, atraumatic, no cyanosis or edema Pulses: 2+ and symmetric Skin: Skin color, texture, turgor normal. No rashes or lesions Lymph nodes: Cervical, supraclavicular, and axillary nodes normal. Neurologic: Alert and oriented X 3, normal strength and tone. Normal symmetric reflexes. Normal coordination and gait     Assessment:    Healthy female exam.      Plan:     See After Visit Summary for Counseling Recommendations    Keep up a regular exercise program and make sure you are eating a healthy diet Try to eat 4 servings of dairy a day, or if you are lactose intolerant take a calcium with vitamin D daily.  Your vaccines are up to date.

## 2016-03-20 LAB — HIV ANTIBODY (ROUTINE TESTING W REFLEX): HIV: NONREACTIVE

## 2016-03-20 LAB — HEPATITIS C ANTIBODY: HCV AB: NEGATIVE

## 2016-03-20 LAB — T4, FREE: Free T4: 1 ng/dL (ref 0.8–1.8)

## 2016-03-21 LAB — THYROID PEROXIDASE ANTIBODY

## 2016-03-27 ENCOUNTER — Ambulatory Visit: Payer: 59

## 2016-03-29 ENCOUNTER — Ambulatory Visit (INDEPENDENT_AMBULATORY_CARE_PROVIDER_SITE_OTHER): Payer: 59

## 2016-03-29 DIAGNOSIS — Z1231 Encounter for screening mammogram for malignant neoplasm of breast: Secondary | ICD-10-CM

## 2016-04-02 ENCOUNTER — Inpatient Hospital Stay: Payer: 59 | Admitting: Family Medicine

## 2016-05-15 ENCOUNTER — Telehealth: Payer: Self-pay | Admitting: *Deleted

## 2016-05-15 NOTE — Telephone Encounter (Signed)
Pt called and lvm asking if Dr. Linford Arnold would call in an Abx for her for an infection.Wanda Ortiz

## 2016-05-15 NOTE — Telephone Encounter (Signed)
lvm informing pt that she would need an appt. Advised that she call and schedule an appt to be seen.Heath Gold'

## 2016-05-17 ENCOUNTER — Ambulatory Visit: Payer: 59 | Admitting: Sports Medicine

## 2016-07-12 ENCOUNTER — Ambulatory Visit (INDEPENDENT_AMBULATORY_CARE_PROVIDER_SITE_OTHER): Payer: 59 | Admitting: Sports Medicine

## 2016-07-12 ENCOUNTER — Encounter: Payer: Self-pay | Admitting: Sports Medicine

## 2016-07-12 DIAGNOSIS — M5416 Radiculopathy, lumbar region: Secondary | ICD-10-CM | POA: Diagnosis not present

## 2016-07-12 DIAGNOSIS — M25522 Pain in left elbow: Secondary | ICD-10-CM | POA: Insufficient documentation

## 2016-07-12 MED ORDER — MELOXICAM 15 MG PO TABS: ORAL_TABLET | ORAL | 3 refills | Status: DC

## 2016-07-12 NOTE — Progress Notes (Signed)
   Subjective:    I'm seeing this patient as a consultation for:  Dr. Nani Gasser  CC: Elbow pain, back pain  HPI: Elbow pain: For the past several weeks has had swelling and pain over the lateral elbow, and brachial radialis muscles, no trauma, no change in physical activity. Pain is moderate, persistent, doesn't radiate.  Back pain: Localized on the left side of the low back, worse with sitting, flexion, Valsalva, driving, no bowel or bladder dysfunction, no saddle numbness, pain does radiate down the left leg to the foot, medial ankle.  Past medical history:  Negative.  See flowsheet/record as well for more information.  Surgical history: Negative.  See flowsheet/record as well for more information.  Family history: Negative.  See flowsheet/record as well for more information.  Social history: Negative.  See flowsheet/record as well for more information.  Allergies, and medications have been entered into the medical record, reviewed, and no changes needed.   Review of Systems: No headache, visual changes, nausea, vomiting, diarrhea, constipation, dizziness, abdominal pain, skin rash, fevers, chills, night sweats, weight loss, swollen lymph nodes, body aches, joint swelling, muscle aches, chest pain, shortness of breath, mood changes, visual or auditory hallucinations.   Objective:   General: Well Developed, well nourished, and in no acute distress.  Neuro/Psych: Alert and oriented x3, extra-ocular muscles intact, able to move all 4 extremities, sensation grossly intact. Skin: Warm and dry, no rashes noted.  Respiratory: Not using accessory muscles, speaking in full sentences, trachea midline.  Cardiovascular: Pulses palpable, no extremity edema. Abdomen: Does not appear distended. Left Elbow: Minimal swelling over the lateral extensor tendons of the brachial radialis, mild bruising. Range of motion full pronation, supination, flexion, extension. Strength is full to all of the  above directions Stable to varus, valgus stress. Negative moving valgus stress test. There is pain over the proximal brachioradialis, there is no pain over the lateral epicondyle itself, I am able to reproduce pain with resisted wrist extension. Ulnar nerve does not sublux. Negative cubital tunnel Tinel's. Back Exam:  Inspection: Unremarkable  Motion: Flexion 45 deg, Extension 45 deg, Side Bending to 45 deg bilaterally,  Rotation to 45 deg bilaterally  SLR laying: Negative  XSLR laying: Negative  Palpable tenderness: None. FABER: negative. Sensory change: Gross sensation intact to all lumbar and sacral dermatomes.  Reflexes: 2+ at both patellar tendons, 2+ at achilles tendons, Babinski's downgoing.  Strength at foot  Plantar-flexion: 5/5 Dorsi-flexion: 5/5 Eversion: 5/5 Inversion: 5/5  Leg strength  Quad: 5/5 Hamstring: 5/5 Hip flexor: 5/5 Hip abductors: 5/5  Gait unremarkable.  Impression and Recommendations:   This case required medical decision making of moderate complexity.  Left lumbar radiculitis Left L4, physical therapy, meloxicam, return to see me in one month if no better.  Left elbow pain Suspect common extensor tendinitis, doesn't quite hurt at the typical location for lateral epicondylitis. Rehabilitation exercises given, meloxicam, return to see me in one month

## 2016-07-12 NOTE — Assessment & Plan Note (Signed)
Suspect common extensor tendinitis, doesn't quite hurt at the typical location for lateral epicondylitis. Rehabilitation exercises given, meloxicam, return to see me in one month

## 2016-07-12 NOTE — Assessment & Plan Note (Signed)
Left L4, physical therapy, meloxicam, return to see me in one month if no better.

## 2016-08-01 ENCOUNTER — Ambulatory Visit: Payer: 59 | Admitting: Rehabilitative and Restorative Service Providers"

## 2016-08-08 ENCOUNTER — Ambulatory Visit: Payer: 59 | Admitting: Sports Medicine

## 2016-09-05 ENCOUNTER — Ambulatory Visit (INDEPENDENT_AMBULATORY_CARE_PROVIDER_SITE_OTHER): Payer: Managed Care, Other (non HMO) | Admitting: Family Medicine

## 2016-09-05 ENCOUNTER — Encounter: Payer: Self-pay | Admitting: Family Medicine

## 2016-09-05 VITALS — BP 150/84 | HR 91 | Ht 66.0 in | Wt 198.0 lb

## 2016-09-05 DIAGNOSIS — R21 Rash and other nonspecific skin eruption: Secondary | ICD-10-CM | POA: Diagnosis not present

## 2016-09-05 DIAGNOSIS — I10 Essential (primary) hypertension: Secondary | ICD-10-CM | POA: Diagnosis not present

## 2016-09-05 DIAGNOSIS — M25512 Pain in left shoulder: Secondary | ICD-10-CM

## 2016-09-05 DIAGNOSIS — M542 Cervicalgia: Secondary | ICD-10-CM

## 2016-09-05 DIAGNOSIS — M25511 Pain in right shoulder: Secondary | ICD-10-CM

## 2016-09-05 DIAGNOSIS — G8929 Other chronic pain: Secondary | ICD-10-CM

## 2016-09-05 DIAGNOSIS — R51 Headache: Secondary | ICD-10-CM | POA: Diagnosis not present

## 2016-09-05 DIAGNOSIS — N62 Hypertrophy of breast: Secondary | ICD-10-CM | POA: Diagnosis not present

## 2016-09-05 DIAGNOSIS — R519 Headache, unspecified: Secondary | ICD-10-CM

## 2016-09-05 MED ORDER — NYSTATIN 100000 UNIT/GM EX POWD: Freq: Four times a day (QID) | CUTANEOUS | 99 refills | Status: DC

## 2016-09-05 NOTE — Progress Notes (Signed)
Subjective:    CC: Macromastia   HPI:  56 year old female comes in today. She would like to discuss breast reduction as an option.She's had very large breasts over the years. She says even when she tries to lose weight she does not lose any weight in her breasts area she is currently a 44 triple D. She says her bra straps digging into her shoulders. She even went and got refitted a couple months ago to try to get some relief and it really has not helped. She gets a lot of neck pain and pain over the trapezius muscles which can trigger headaches. She is tired of just not feeling well and would like to do something about it. She says she's always constantly battling a rash underneath the breast tissue because of trapped moisture and gets frequent yeast infections in that area.  Hypertension- Pt denies chest pain, SOB, dizziness, or heart palpitations.  Taking meds as directed w/o problems.  Denies medication side effects.     BP (!) 150/84   Pulse 91   Ht 5\' 6"  (1.676 m)   Wt 198 lb (89.8 kg)   SpO2 97%   BMI 31.96 kg/m     Allergies  Allergen Reactions  . Fish Allergy Hives  . Shellfish Allergy Hives    Past Medical History:  Diagnosis Date  . AR (allergic rhinitis)   . Hyperlipemia   . Hypertension   . Migraines     Past Surgical History:  Procedure Laterality Date  . ABDOMINAL HYSTERECTOMY     partial with BSO for Brenners Tumor    Social History   Social History  . Marital status: Single    Spouse name: N/A  . Number of children: N/A  . Years of education: N/A   Occupational History  . Not on file.   Social History Main Topics  . Smoking status: Current Every Day Smoker    Packs/day: 0.30    Years: 15.00    Types: Cigarettes  . Smokeless tobacco: Never Used  . Alcohol use 1.0 oz/week    2 Standard drinks or equivalent per week  . Drug use: No  . Sexual activity: Yes   Other Topics Concern  . Not on file   Social History Narrative  . No narrative on  file    Family History  Problem Relation Age of Onset  . Diabetes Mother   . Hyperlipidemia Mother   . Hypertension Father   . Cancer Father     prostate    Outpatient Encounter Prescriptions as of 09/05/2016  Medication Sig  . cetirizine-pseudoephedrine (ZYRTEC-D) 5-120 MG per tablet Take 1 tablet by mouth daily as needed.   . fluticasone (FLONASE) 50 MCG/ACT nasal spray Place 2 sprays into both nostrils daily.  . hydrochlorothiazide (MICROZIDE) 12.5 MG capsule Take 1 capsule (12.5 mg total) by mouth daily.  . meloxicam (MOBIC) 15 MG tablet One tab PO qAM with breakfast for 2 weeks, then daily prn pain.  . naratriptan (AMERGE) 2.5 MG tablet Take 1 tablet (2.5 mg total) by mouth as needed. Take one (1) tablet at onset of headache; if returns or does not resolve, may repeat after 4 hours; do not exceed five (5) mg in 24 hours.  . pravastatin (PRAVACHOL) 40 MG tablet Take 1 tablet (40 mg total) by mouth daily.  11/05/2016 nystatin (MYCOSTATIN/NYSTOP) powder Apply topically 4 (four) times daily.   No facility-administered encounter medications on file as of 09/05/2016.  Review of Systems: No fevers, chills, night sweats, weight loss, chest pain, or shortness of breath.   Objective:    Physical Exam  Constitutional: She is oriented to person, place, and time. She appears well-developed and well-nourished.  HENT:  Head: Normocephalic and atraumatic.  Eyes: Conjunctivae and EOM are normal.  Cardiovascular: Normal rate.   Pulmonary/Chest: Effort normal. Right breast exhibits no inverted nipple, no mass, no nipple discharge, no skin change and no tenderness. Left breast exhibits no inverted nipple, no mass, no nipple discharge, no skin change and no tenderness.  Large pendulous breasts.   Musculoskeletal:  Normal neck flexion, extension, rotation right and left.   Neurological: She is alert and oriented to person, place, and time.  Skin: Skin is dry. No pallor.  She has a dusky  appearing wet rash underneath both breasts creases.  Psychiatric: She has a normal mood and affect. Her behavior is normal.  Vitals reviewed.     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 We'll go ahead and place referral for consultation to plastic surgery. I do think she would truly benefit because of the complications she's experiencing.-   HTN - she forgot to take her blood pressure pills this morning so encouraged her to follow up in about 2 weeks for nurse blood pressure check.  Neck pain - secondary to large breast tissue causing strain on her back. Again I think breast reduction would be the most helpful treatment for her.  HA - injury to neck pain which is triggered from her large breasts.  Infection/yeast  of breast tissue-we'll treat with topical nystatin powder.

## 2016-09-17 ENCOUNTER — Ambulatory Visit (INDEPENDENT_AMBULATORY_CARE_PROVIDER_SITE_OTHER): Payer: Managed Care, Other (non HMO) | Admitting: Family Medicine

## 2016-09-17 VITALS — BP 140/80 | HR 90 | Wt 198.0 lb

## 2016-09-17 DIAGNOSIS — I10 Essential (primary) hypertension: Secondary | ICD-10-CM | POA: Diagnosis not present

## 2016-09-17 NOTE — Progress Notes (Signed)
Patient came into clinic today for BP recheck. Pt reports she is taking her BP Rx as directed. BP in office today was 140/75 at first check, then 140/80 on second check. Denies any headaches, dizziness, palpitations, or chest pain. Advised I would send this to PCP for review and we would contact her with any changes. Verbalized understanding.

## 2016-09-17 NOTE — Progress Notes (Signed)
Reviewed note. I would like to add lisinopril to her HCTZ. This comes in a combination pill so she would just take 1 pill 1 time a day. It's also generic. If she is okay with this also this over to the pharmacy and I like to have her come back in 2-3 weeks for another nurse visit for blood pressure check. We will also need to check a BMP at that time.  Nani Gasser, MD

## 2016-09-18 NOTE — Progress Notes (Signed)
Left VM with Pt to see if she would be OK with Rx change. Requested callback, information provided.

## 2016-09-19 NOTE — Progress Notes (Signed)
Left VM for Pt to return clinic call regarding potential Rx change. Callback information provided.

## 2016-12-02 ENCOUNTER — Other Ambulatory Visit: Payer: Self-pay | Admitting: Sports Medicine

## 2016-12-02 DIAGNOSIS — M5416 Radiculopathy, lumbar region: Secondary | ICD-10-CM

## 2017-06-26 DIAGNOSIS — M2022 Hallux rigidus, left foot: Secondary | ICD-10-CM | POA: Insufficient documentation

## 2017-11-04 ENCOUNTER — Encounter: Payer: Managed Care, Other (non HMO) | Admitting: Family Medicine

## 2018-01-08 ENCOUNTER — Ambulatory Visit: Payer: Self-pay | Admitting: Physician Assistant

## 2018-01-09 ENCOUNTER — Emergency Department
Admission: EM | Admit: 2018-01-09 | Discharge: 2018-01-09 | Disposition: A | Discharge status: Home / Self Care | Payer: Self-pay | Attending: Emergency Medicine | Admitting: Emergency Medicine

## 2018-01-09 ENCOUNTER — Encounter: Payer: Self-pay | Admitting: Emergency Medicine

## 2018-01-09 DIAGNOSIS — F17210 Nicotine dependence, cigarettes, uncomplicated: Secondary | ICD-10-CM | POA: Insufficient documentation

## 2018-01-09 DIAGNOSIS — N3001 Acute cystitis with hematuria: Secondary | ICD-10-CM | POA: Insufficient documentation

## 2018-01-09 DIAGNOSIS — I10 Essential (primary) hypertension: Secondary | ICD-10-CM | POA: Insufficient documentation

## 2018-01-09 DIAGNOSIS — Z79899 Other long term (current) drug therapy: Secondary | ICD-10-CM | POA: Insufficient documentation

## 2018-01-09 LAB — URINALYSIS, COMPLETE (UACMP) WITH MICROSCOPIC
BILIRUBIN URINE: NEGATIVE
GLUCOSE, UA: NEGATIVE mg/dL
KETONES UR: NEGATIVE mg/dL
Nitrite: POSITIVE — AB
PH: 5 (ref 5.0–8.0)
Protein, ur: 100 mg/dL — AB
SPECIFIC GRAVITY, URINE: 1.02 (ref 1.005–1.030)
WBC, UA: >50 WBC/hpf — ABNORMAL HIGH (ref 0–5)

## 2018-01-09 MED ORDER — SULFAMETHOXAZOLE-TRIMETHOPRIM 800-160 MG PO TABS: 1.0000 | ORAL_TABLET | Freq: Two times a day (BID) | ORAL | 0 refills | Status: DC

## 2018-01-09 NOTE — ED Notes (Signed)

## 2018-01-09 NOTE — ED Notes (Signed)
See triage note  Presents with painful urination for couple of days Also noticed some blood with wiping  No fever

## 2018-01-09 NOTE — ED Triage Notes (Signed)
C/O hematuria x 2 days.  Also c/o "pressure" at the end of voiding.

## 2018-01-09 NOTE — ED Provider Notes (Signed)
[facility] Emergency Department Provider Note   ____________________________________________   First MD Initiated Contact with Patient 01/09/18 1046     (approximate)  I have reviewed the triage vital signs and the nursing notes.   HISTORY  Chief Complaint Dysuria   HPI Wanda Ortiz is a 57 y.o. female is here with complaint of hematuria for the last 2 days.  Patient states she has a pressure sensation at the end of voiding.  She denies any fever, chills, nausea or vomiting.  Patient denies any flank pain or history of kidney stones.  Patient does have a long history of UTIs.  Patient rates her pain as 0/10.   Past Medical History:  Diagnosis Date  . AR (allergic rhinitis)   . Hyperlipemia   . Hypertension   . Migraines     Patient Active Problem List   Diagnosis Date Noted  . Macromastia 09/05/2016  . Left elbow pain 07/12/2016  . Left lumbar radiculitis 07/12/2016  . Essential hypertension 02/26/2016  . Hyperlipidemia 03/24/2015  . Rotator cuff syndrome of left shoulder 04/23/2013  . CARPAL TUNNEL SYNDROME 05/03/2009  . POSTMENOPAUSAL STATUS 01/20/2008  . OVARIAN MASS 12/31/2006  . MIGRAINE, UNSPEC., W/O INTRACTABLE MIGRAINE 04/08/2006    Past Surgical History:  Procedure Laterality Date  . ABDOMINAL HYSTERECTOMY     partial with BSO for Brenners Tumor    Prior to Admission medications   Medication Sig Start Date End Date Taking? Authorizing Provider  cetirizine-pseudoephedrine (ZYRTEC-D) 5-120 MG per tablet Take 1 tablet by mouth daily as needed.     [provider]  fluticasone (FLONASE) 50 MCG/ACT nasal spray Place 2 sprays into both nostrils daily. 03/23/14   Breeback, Lonna Cobb, PA-C  hydrochlorothiazide (MICROZIDE) 12.5 MG capsule Take 1 capsule (12.5 mg total) by mouth daily. 03/19/16   Agapito Games, MD  meloxicam (MOBIC) 15 MG tablet TAKE 1 TABLET BY MOUTH EVREY MORNING WITH BREAKFAST FOR 2 WEEKS AS NEEDED  FOR PAIN 12/02/16   Monica Becton, MD  naratriptan (AMERGE) 2.5 MG tablet Take 1 tablet (2.5 mg total) by mouth as needed. Take one (1) tablet at onset of headache; if returns or does not resolve, may repeat after 4 hours; do not exceed five (5) mg in 24 hours. 12/12/15   Agapito Games, MD  nystatin (MYCOSTATIN/NYSTOP) powder Apply topically 4 (four) times daily. 09/05/16   Agapito Games, MD  pravastatin (PRAVACHOL) 40 MG tablet Take 1 tablet (40 mg total) by mouth daily. 03/19/16   Agapito Games, MD  sulfamethoxazole-trimethoprim (BACTRIM DS,SEPTRA DS) 800-160 MG tablet Take 1 tablet by mouth 2 (two) times daily. 01/09/18   Tommi Rumps, PA-C    Allergies Fish allergy and Shellfish allergy  Family History  Problem Relation Age of Onset  . Diabetes Mother   . Hyperlipidemia Mother   . Hypertension Father   . Cancer Father        prostate    Social History Social History   Tobacco Use  . Smoking status: Current Every Day Smoker    Packs/day: 0.30    Years: 15.00    Pack years: 4.50    Types: Cigarettes  . Smokeless tobacco: Never Used  Substance Use Topics  . Alcohol use: Yes    Alcohol/week: 1.2 oz    Types: 2 Standard drinks or equivalent per week  . Drug use: No    Review of Systems Constitutional: No fever/chills Cardiovascular: Denies chest pain. Respiratory:  Denies shortness of breath. Gastrointestinal: No abdominal pain.  No nausea, no vomiting.  Negative for flank pain. Genitourinary: Positive for dysuria. Musculoskeletal: Negative for back pain. Skin: Negative for rash. Neurological: Negative for headaches, focal weakness or numbness. ___________________________________________   PHYSICAL EXAM:  VITAL SIGNS: ED Triage Vitals [01/09/18 1030]  Enc Vitals Group     BP      Pulse      Resp      Temp      Temp src      SpO2      Weight 180 lb (81.6 kg)     Height 5\' 5"  (1.651 m)     Head Circumference      Peak Flow       Pain Score 0     Pain Loc      Pain Edu?      Excl. in GC?    Constitutional: Alert and oriented. Well appearing and in no acute distress. Eyes: Conjunctivae are normal.  Head: Atraumatic. Neck: No stridor.   Cardiovascular: Normal rate, regular rhythm. Grossly normal heart sounds.  Good peripheral circulation. Respiratory: Normal respiratory effort.  No retractions. Lungs CTAB. Gastrointestinal: Soft and nontender. No distention.  No CVA tenderness. Musculoskeletal: Moves upper and lower extremities without any difficulty.  Normal gait was noted. Neurologic:  Normal speech and language. No gross focal neurologic deficits are appreciated. No gait instability. Skin:  Skin is warm, dry and intact. No rash noted. Psychiatric: Mood and affect are normal. Speech and behavior are normal.  ____________________________________________   LABS (all labs ordered are listed, but only abnormal results are displayed)  Labs Reviewed  URINALYSIS, COMPLETE (UACMP) WITH MICROSCOPIC - Abnormal; Notable for the following components:      Result Value   Color, Urine YELLOW (*)    APPearance CLOUDY (*)    Hgb urine dipstick LARGE (*)    Protein, ur 100 (*)    Nitrite POSITIVE (*)    Leukocytes, UA MODERATE (*)    RBC / HPF >50 (*)    WBC, UA >50 (*)    Bacteria, UA FEW (*)    Non Squamous Epithelial 0-5 (*)    All other components within normal limits  URINE CULTURE   ____________________________________________  PROCEDURES  Procedure(s) performed: None  Procedures  Critical Care performed: No  ____________________________________________   INITIAL IMPRESSION / ASSESSMENT AND PLAN / ED COURSE  As part of my medical decision making, I reviewed the following data within the electronic MEDICAL RECORD NUMBER Notes from prior ED visits and [place] Controlled Substance Database  Culture was ordered on patient's urine as she has had multiple urinary tract infections in the past.  She was started on  Bactrim DS twice daily for 10 days.  She will increase fluids.  She is encouraged to follow-up with her PCP if any continued problems.  ____________________________________________   FINAL CLINICAL IMPRESSION(S) / ED DIAGNOSES  Final diagnoses:  Acute hemorrhagic cystitis     ED Discharge Orders        Ordered    sulfamethoxazole-trimethoprim (BACTRIM DS,SEPTRA DS) 800-160 MG tablet  2 times daily     01/09/18 1136       Note:  This document was prepared using Dragon voice recognition software and may include unintentional dictation errors.    03/12/18, PA-C 01/09/18 1409    03/12/18, MD 01/09/18 1520

## 2018-01-09 NOTE — Discharge Instructions (Addendum)
Follow-up with your primary care provider if any continued problems.  Increase fluids.  You may take Tylenol or ibuprofen if needed for pain.  Begin taking antibiotics today, twice a day for the next 10 days.

## 2018-01-11 LAB — URINE CULTURE
Culture: >=100000 — AB
SPECIAL REQUESTS: NORMAL

## 2018-04-14 LAB — HEPATIC FUNCTION PANEL
ALT: 15 (ref 7–35)
AST: 21 (ref 13–35)
Alkaline Phosphatase: 119 (ref 25–125)
Bilirubin, Total: 0.3

## 2018-04-14 LAB — CBC AND DIFFERENTIAL
HCT: 42 (ref 36–46)
HEMOGLOBIN: 13.7 (ref 12.0–16.0)
Platelets: 198 (ref 150–399)
WBC: 8.7

## 2018-04-14 LAB — BASIC METABOLIC PANEL
BUN: 12 (ref 4–21)
Creatinine: 0.8 (ref 0.5–1.1)
Glucose: 78
Potassium: 3.1 — AB (ref 3.4–5.3)
SODIUM: 144 (ref 137–147)

## 2018-04-24 ENCOUNTER — Encounter: Payer: Self-pay | Admitting: Family Medicine

## 2018-10-28 DIAGNOSIS — H2513 Age-related nuclear cataract, bilateral: Secondary | ICD-10-CM | POA: Insufficient documentation

## 2018-10-28 DIAGNOSIS — H3581 Retinal edema: Secondary | ICD-10-CM | POA: Insufficient documentation

## 2018-10-28 DIAGNOSIS — H30033 Focal chorioretinal inflammation, peripheral, bilateral: Secondary | ICD-10-CM | POA: Insufficient documentation

## 2018-11-20 ENCOUNTER — Encounter: Payer: Self-pay | Admitting: Family Medicine

## 2018-11-20 ENCOUNTER — Ambulatory Visit (INDEPENDENT_AMBULATORY_CARE_PROVIDER_SITE_OTHER): Payer: Self-pay | Admitting: Family Medicine

## 2018-11-20 VITALS — BP 178/94 | HR 78 | Temp 98.4°F

## 2018-11-20 DIAGNOSIS — J302 Other seasonal allergic rhinitis: Secondary | ICD-10-CM

## 2018-11-20 DIAGNOSIS — M329 Systemic lupus erythematosus, unspecified: Secondary | ICD-10-CM

## 2018-11-20 DIAGNOSIS — E785 Hyperlipidemia, unspecified: Secondary | ICD-10-CM

## 2018-11-20 DIAGNOSIS — G43809 Other migraine, not intractable, without status migrainosus: Secondary | ICD-10-CM

## 2018-11-20 DIAGNOSIS — H4389 Other disorders of vitreous body: Secondary | ICD-10-CM

## 2018-11-20 DIAGNOSIS — I10 Essential (primary) hypertension: Secondary | ICD-10-CM

## 2018-11-20 MED ORDER — FLUTICASONE PROPIONATE 50 MCG/ACT NA SUSP: 2.0000 | Freq: Every day | NASAL | 3 refills | Status: DC

## 2018-11-20 MED ORDER — HYDROCHLOROTHIAZIDE 12.5 MG PO CAPS: 12.5000 mg | ORAL_CAPSULE | Freq: Every day | ORAL | 3 refills | Status: DC

## 2018-11-20 MED ORDER — PRAVASTATIN SODIUM 40 MG PO TABS: 40.0000 mg | ORAL_TABLET | Freq: Every day | ORAL | 3 refills | Status: DC

## 2018-11-20 MED ORDER — NARATRIPTAN HCL 2.5 MG PO TABS: 2.5000 mg | ORAL_TABLET | ORAL | 99 refills | Status: AC | PRN

## 2018-11-20 NOTE — Progress Notes (Signed)
Established Patient Office Visit  Subjective:  Patient ID: Wanda Ortiz, female    DOB: 26-Nov-1960  Age: 58 y.o. MRN: [id number]  CC:  Chief Complaint  Patient presents with  . Eye Problem    HPI Wanda Ortiz presents for recent diagnosis of lupus.  She says she was initially on event evaluated for blurry vision she said she had a lot of spots in her vision particularly her right eye.  She was evaluated and diagnosed with vitreitis.  She was then referred to Dr. Clelia CroftShaw who is an eye specialist who did a lot of blood work and diagnosed her with lupus.  He has put her on methotrexate, folic acid, and prednisone.  She then developed a fungal infection in her mouth so she is also on Diflucan right now.  She has a follow-up in about 3 weeks with the eye doctor again.  She says she just was not sure what to do at this point and so decided to follow-up.  No family history of autoimmune disease except for a cousin who also has a diagnosis of lupus.  She says over the last couple of weeks she started to notice some pigmentation across both of her facial cheeks that was not there.  She also notes she is had a lot of pain in her shoulders upper back and knees.  She denies any actual joint swelling or fevers or chills.  GRAIN headaches-overall she is really not having a big problem with frequency.  But would like a refill on her Amerge.  She says they do not come as often as they used to.  Hypertension- Pt denies chest pain, SOB, dizziness, or heart palpitations.  She is currently off of medication.  She has been without health insurance and so has not come in.  Denies medication side effects.      Past Medical History:  Diagnosis Date  . AR (allergic rhinitis)   . Hyperlipemia   . Hypertension   . Migraines     Past Surgical History:  Procedure Laterality Date  . ABDOMINAL HYSTERECTOMY     partial with BSO for Brenners Tumor    Family History  Problem Relation Age of Onset  .  Diabetes Mother   . Hyperlipidemia Mother   . Hypertension Father   . Cancer Father        prostate    Social History   Socioeconomic History  . Marital status: Single    Spouse name: Not on file  . Number of children: Not on file  . Years of education: Not on file  . Highest education level: Not on file  Occupational History  . Not on file  Social Needs  . Financial resource strain: Not on file  . Food insecurity:    Worry: Not on file    Inability: Not on file  . Transportation needs:    Medical: Not on file    Non-medical: Not on file  Tobacco Use  . Smoking status: Current Every Day Smoker    Packs/day: 0.30    Years: 15.00    Pack years: 4.50    Types: Cigarettes  . Smokeless tobacco: Never Used  Substance and Sexual Activity  . Alcohol use: Yes    Alcohol/week: 2.0 standard drinks    Types: 2 Standard drinks or equivalent per week  . Drug use: No  . Sexual activity: Yes  Lifestyle  . Physical activity:    Days per week: Not on file  Minutes per session: Not on file  . Stress: Not on file  Relationships  . Social connections:    Talks on phone: Not on file    Gets together: Not on file    Attends religious service: Not on file    Active member of club or organization: Not on file    Attends meetings of clubs or organizations: Not on file    Relationship status: Not on file  . Intimate partner violence:    Fear of current or ex partner: Not on file    Emotionally abused: Not on file    Physically abused: Not on file    Forced sexual activity: Not on file  Other Topics Concern  . Not on file  Social History Narrative  . Not on file    Outpatient Medications Prior to Visit  Medication Sig Dispense Refill  . fluconazole (DIFLUCAN) 200 MG tablet Take 2 tablets by mouth daily.    . folic acid (FOLVITE) 1 MG tablet Take 1 tablet by mouth daily.    . methotrexate (RHEUMATREX) 2.5 MG tablet Take 4 tablets by mouth once a week.    . predniSONE  (DELTASONE) 10 MG tablet Take by mouth.    . cetirizine-pseudoephedrine (ZYRTEC-D) 5-120 MG per tablet Take 1 tablet by mouth daily as needed.     . meloxicam (MOBIC) 15 MG tablet TAKE 1 TABLET BY MOUTH EVREY MORNING WITH BREAKFAST FOR 2 WEEKS AS NEEDED FOR PAIN (Patient not taking: Reported on 11/20/2018) 30 tablet 1  . fluticasone (FLONASE) 50 MCG/ACT nasal spray Place 2 sprays into both nostrils daily. (Patient not taking: Reported on 11/20/2018) 16 g 3  . hydrochlorothiazide (MICROZIDE) 12.5 MG capsule Take 1 capsule (12.5 mg total) by mouth daily. (Patient not taking: Reported on 11/20/2018) 90 capsule 3  . naratriptan (AMERGE) 2.5 MG tablet Take 1 tablet (2.5 mg total) by mouth as needed. Take one (1) tablet at onset of headache; if returns or does not resolve, may repeat after 4 hours; do not exceed five (5) mg in 24 hours. (Patient not taking: Reported on 11/20/2018) 10 tablet 0  . nystatin (MYCOSTATIN/NYSTOP) powder Apply topically 4 (four) times daily. (Patient not taking: Reported on 11/20/2018) 56.7 g PRN  . pravastatin (PRAVACHOL) 40 MG tablet Take 1 tablet (40 mg total) by mouth daily. (Patient not taking: Reported on 11/20/2018) 90 tablet 3  . sulfamethoxazole-trimethoprim (BACTRIM DS,SEPTRA DS) 800-160 MG tablet Take 1 tablet by mouth 2 (two) times daily. 20 tablet 0   No facility-administered medications prior to visit.     Allergies  Allergen Reactions  . Fish Allergy Hives  . Shellfish Allergy Hives    ROS Review of Systems    Objective:    Physical Exam  Constitutional: She is oriented to person, place, and time. She appears well-developed and well-nourished.  HENT:  Head: Normocephalic and atraumatic.  Neck: Neck supple. No thyromegaly present.  Cardiovascular: Normal rate, regular rhythm and normal heart sounds.  Radial pulses 2+.  Pulmonary/Chest: Effort normal and breath sounds normal.  Musculoskeletal:        General: No edema.  Lymphadenopathy:    She has no  cervical adenopathy.  Neurological: She is alert and oriented to person, place, and time.  Skin: Skin is warm and dry.  Psychiatric: She has a normal mood and affect. Her behavior is normal.    BP (!) 178/94   Pulse 78   Temp 98.4 F (36.9 C) (Oral)  Wt Readings from  Last 3 Encounters:  01/09/18 180 lb (81.6 kg)  09/17/16 198 lb (89.8 kg)  09/05/16 198 lb (89.8 kg)     Health Maintenance Due  Topic Date Due  . MAMMOGRAM  03/29/2018    There are no preventive care reminders to display for this patient.  Lab Results  Component Value Date   TSH 0.28 (L) 03/19/2016   Lab Results  Component Value Date   WBC 8.7 04/14/2018   HGB 13.7 04/14/2018   HCT 42 04/14/2018   MCV 85.7 03/22/2015   PLT 198 04/14/2018   Lab Results  Component Value Date   NA 144 04/14/2018   K 3.1 (A) 04/14/2018   CO2 27 03/19/2016   GLUCOSE 89 03/19/2016   BUN 12 04/14/2018   CREATININE 0.8 04/14/2018   BILITOT 0.4 03/19/2016   ALKPHOS 119 04/14/2018   AST 21 04/14/2018   ALT 15 04/14/2018   PROT 6.8 03/19/2016   ALBUMIN 4.0 03/19/2016   CALCIUM 8.8 03/19/2016   Lab Results  Component Value Date   CHOL 260 (H) 03/19/2016   Lab Results  Component Value Date   HDL 41 (L) 03/19/2016   Lab Results  Component Value Date   LDLCALC NOT CALC 03/19/2016   Lab Results  Component Value Date   TRIG 409 (H) 03/19/2016   Lab Results  Component Value Date   CHOLHDL 6.3 (H) 03/19/2016   No results found for: HGBA1C    Assessment & Plan:   Problem List Items Addressed This Visit      Cardiovascular and Mediastinum   Essential hypertension   Relevant Medications   pravastatin (PRAVACHOL) 40 MG tablet   hydrochlorothiazide (MICROZIDE) 12.5 MG capsule     Other   Hyperlipidemia   Relevant Medications   pravastatin (PRAVACHOL) 40 MG tablet   hydrochlorothiazide (MICROZIDE) 12.5 MG capsule    Other Visit Diagnoses    SLE (systemic lupus erythematosus related syndrome) (HCC)    -   Primary   Seasonal allergies       Relevant Medications   fluticasone (FLONASE) 50 MCG/ACT nasal spray      New diagnosis of lupus-I would like to refer her to rheumatology to get her established. There is someone in particular that she is interested in and she will let me know their name.  If not she is okay with me placing referral.  Meds ordered this encounter  Medications  . pravastatin (PRAVACHOL) 40 MG tablet    Sig: Take 1 tablet (40 mg total) by mouth daily.    Dispense:  90 tablet    Refill:  3  . naratriptan (AMERGE) 2.5 MG tablet    Sig: Take 1 tablet (2.5 mg total) by mouth as needed. Take one (1) tablet at onset of headache; if returns or does not resolve, may repeat after 4 hours; do not exceed five (5) mg in 24 hours.    Dispense:  10 tablet    Refill:  PRN  . fluticasone (FLONASE) 50 MCG/ACT nasal spray    Sig: Place 2 sprays into both nostrils daily.    Dispense:  16 g    Refill:  3  . hydrochlorothiazide (MICROZIDE) 12.5 MG capsule    Sig: Take 1 capsule (12.5 mg total) by mouth daily.    Dispense:  90 capsule    Refill:  3    Follow-up: Return in about 6 months (around 05/23/2019), or if symptoms worsen or fail to improve.    Nani Gasser,  MD

## 2018-11-20 NOTE — Assessment & Plan Note (Signed)
Uncontrolled today.  She will go ahead and restart her medication.  At her follow-up appointment with the eye doctor in a few weeks I asked her to have them check her blood pressure and call me back.  If it still elevated then we will need to adjust her regimen.

## 2018-11-20 NOTE — Assessment & Plan Note (Signed)
We will restart her pravastatin.  New prescription sent to pharmacy.

## 2018-11-20 NOTE — Assessment & Plan Note (Signed)
Refilled her Amerge.  She does not need prophylaxis at this point.

## 2018-12-08 ENCOUNTER — Telehealth: Payer: Self-pay

## 2018-12-08 NOTE — Telephone Encounter (Signed)
I am sorry for the miscommunication. I thought she was going to call back with a name of someone a friend sees.    OK, I would recommend Dr. Jerene Pitch with [facility] in [place].  But I can refer her to [facility]. Either way. Just let Jenny Reichmann know so she can complete the referral.

## 2018-12-08 NOTE — Telephone Encounter (Signed)
Patient called stating she was waiting for Dr Madilyn Fireman to enter referral for Rheumatologist. Current referral placed on 11/20/18 states patient was going to call with the name of a provider.. but patient was under the impression that dr Madilyn Fireman was picking someone for her. She wishes to see someone at [facility]. Please advise

## 2018-12-08 NOTE — Telephone Encounter (Signed)
Thanks! Msg to [facility]

## 2019-02-12 ENCOUNTER — Ambulatory Visit: Payer: Self-pay | Admitting: Family Medicine

## 2019-03-03 ENCOUNTER — Ambulatory Visit (INDEPENDENT_AMBULATORY_CARE_PROVIDER_SITE_OTHER): Payer: Medicaid Other | Admitting: Family Medicine

## 2019-03-03 ENCOUNTER — Encounter: Payer: Self-pay | Admitting: Family Medicine

## 2019-03-03 VITALS — Temp 96.5°F | Ht 66.0 in | Wt 205.0 lb

## 2019-03-03 DIAGNOSIS — Z1231 Encounter for screening mammogram for malignant neoplasm of breast: Secondary | ICD-10-CM

## 2019-03-03 DIAGNOSIS — J069 Acute upper respiratory infection, unspecified: Secondary | ICD-10-CM

## 2019-03-03 DIAGNOSIS — R059 Cough, unspecified: Secondary | ICD-10-CM

## 2019-03-03 DIAGNOSIS — R05 Cough: Secondary | ICD-10-CM

## 2019-03-03 MED ORDER — HYDROCODONE-HOMATROPINE 5-1.5 MG/5ML PO SYRP: 5.0000 mL | ORAL_SOLUTION | Freq: Every evening | ORAL | 0 refills | Status: DC | PRN

## 2019-03-03 NOTE — Progress Notes (Signed)
Virtual Visit via Video Note  I connected with Wanda Ortiz on 03/03/19 at  9:30 AM EDT by a video enabled telemedicine application and verified that I am speaking with the correct person using two identifiers.   I discussed the limitations of evaluation and management by telemedicine and the availability of in person appointments. The patient expressed understanding and agreed to proceed.      Acute Office Visit  Subjective:    Patient ID: Wanda Ortiz, female    DOB: 26-Mar-1961, 58 y.o.   MRN: [id number]  Chief Complaint  Patient presents with  . Cough    HPI Patient is in today for Cough x 5 days.  Pt reports that this cough began Saturday night and this keeps her up. She stated that its a deep cough. Denies any f/s/c/n/v/d.  She denies any watery itchy eyes or sneezing or itchy nose.  She does not normally have allergies this time year.  She denies any wheezing shortness of breath or chest tightness.  She says the cough is not bad during the day and she is able to get a little bit of phlegm up.  It is mostly at night the cough will wake her up she will cough and cough and then cannot go back to sleep.  She has not been around anybody with COVID. She quit smoking in June.   She has been using Mucinex for the cough and it helps some.  Past Medical History:  Diagnosis Date  . AR (allergic rhinitis)   . Hyperlipemia   . Hypertension   . Migraines     Past Surgical History:  Procedure Laterality Date  . ABDOMINAL HYSTERECTOMY     partial with BSO for Brenners Tumor    Family History  Problem Relation Age of Onset  . Diabetes Mother   . Hyperlipidemia Mother   . Hypertension Father   . Cancer Father        prostate    Social History   Socioeconomic History  . Marital status: Single    Spouse name: Not on file  . Number of children: Not on file  . Years of education: Not on file  . Highest education level: Not on file  Occupational History  . Not on  file  Social Needs  . Financial resource strain: Not on file  . Food insecurity    Worry: Not on file    Inability: Not on file  . Transportation needs    Medical: Not on file    Non-medical: Not on file  Tobacco Use  . Smoking status: Former Smoker    Packs/day: 0.30    Years: 15.00    Pack years: 4.50    Types: Cigarettes    Quit date: 11/30/2018    Years since quitting: 0.2  . Smokeless tobacco: Never Used  Substance and Sexual Activity  . Alcohol use: Yes    Alcohol/week: 2.0 standard drinks    Types: 2 Standard drinks or equivalent per week  . Drug use: No  . Sexual activity: Yes  Lifestyle  . Physical activity    Days per week: Not on file    Minutes per session: Not on file  . Stress: Not on file  Relationships  . Social Herbalist on phone: Not on file    Gets together: Not on file    Attends religious service: Not on file    Active member of club or organization: Not on file  Attends meetings of clubs or organizations: Not on file    Relationship status: Not on file  . Intimate partner violence    Fear of current or ex partner: Not on file    Emotionally abused: Not on file    Physically abused: Not on file    Forced sexual activity: Not on file  Other Topics Concern  . Not on file  Social History Narrative  . Not on file    Outpatient Medications Prior to Visit  Medication Sig Dispense Refill  . cetirizine-pseudoephedrine (ZYRTEC-D) 5-120 MG per tablet Take 1 tablet by mouth daily as needed.     . diclofenac sodium (VOLTAREN) 1 % GEL 2 gm to knee as needed for pain    . fluticasone (FLONASE) 50 MCG/ACT nasal spray Place 2 sprays into both nostrils daily. 16 g 3  . folic acid (FOLVITE) 1 MG tablet Take 1 tablet by mouth daily.    . hydrochlorothiazide (MICROZIDE) 12.5 MG capsule Take 1 capsule (12.5 mg total) by mouth daily. 90 capsule 3  . methotrexate (RHEUMATREX) 2.5 MG tablet Take 4 tablets by mouth once a week.    . naratriptan (AMERGE)  2.5 MG tablet Take 1 tablet (2.5 mg total) by mouth as needed. Take one (1) tablet at onset of headache; if returns or does not resolve, may repeat after 4 hours; do not exceed five (5) mg in 24 hours. 10 tablet PRN  . pravastatin (PRAVACHOL) 40 MG tablet Take 1 tablet (40 mg total) by mouth daily. 90 tablet 3  . fluconazole (DIFLUCAN) 200 MG tablet Take 2 tablets by mouth daily.    . meloxicam (MOBIC) 15 MG tablet TAKE 1 TABLET BY MOUTH EVREY MORNING WITH BREAKFAST FOR 2 WEEKS AS NEEDED FOR PAIN (Patient not taking: Reported on 11/20/2018) 30 tablet 1  . predniSONE (DELTASONE) 10 MG tablet Take by mouth.     No facility-administered medications prior to visit.     Allergies  Allergen Reactions  . Fish Allergy Hives  . Shellfish Allergy Hives    ROS     Objective:    Physical Exam  Temp (!) 96.5 F (35.8 C)   Ht 5\' 6"  (1.676 m)   Wt 205 lb (93 kg)   BMI 33.09 kg/m  Wt Readings from Last 3 Encounters:  03/03/19 205 lb (93 kg)  01/09/18 180 lb (81.6 kg)  09/17/16 198 lb (89.8 kg)    Health Maintenance Due  Topic Date Due  . MAMMOGRAM  03/29/2018    There are no preventive care reminders to display for this patient.   Lab Results  Component Value Date   TSH 0.28 (L) 03/19/2016   Lab Results  Component Value Date   WBC 8.7 04/14/2018   HGB 13.7 04/14/2018   HCT 42 04/14/2018   MCV 85.7 03/22/2015   PLT 198 04/14/2018   Lab Results  Component Value Date   NA 144 04/14/2018   K 3.1 (A) 04/14/2018   CO2 27 03/19/2016   GLUCOSE 89 03/19/2016   BUN 12 04/14/2018   CREATININE 0.8 04/14/2018   BILITOT 0.4 03/19/2016   ALKPHOS 119 04/14/2018   AST 21 04/14/2018   ALT 15 04/14/2018   PROT 6.8 03/19/2016   ALBUMIN 4.0 03/19/2016   CALCIUM 8.8 03/19/2016   Lab Results  Component Value Date   CHOL 260 (H) 03/19/2016   Lab Results  Component Value Date   HDL 41 (L) 03/19/2016   Lab Results  Component Value  Date   LDLCALC NOT CALC 03/19/2016   Lab  Results  Component Value Date   TRIG 409 (H) 03/19/2016   Lab Results  Component Value Date   CHOLHDL 6.3 (H) 03/19/2016   No results found for: HGBA1C     Assessment & Plan:   Problem List Items Addressed This Visit    None    Visit Diagnoses    Screening mammogram, encounter for    -  Primary   Relevant Orders   MM 3D SCREEN BREAST BILATERAL   Cough       Viral upper respiratory tract infection         Cough-most consistent with viral illness-we will send her prescription for cough medicine at bedtime.  If not improving after the weekend then please let us know.  If she develops new symptoms or worsening symptoms then please let us know immediately.  If she is not improving or continues to get worse then will test her for COVID as well.  Meds ordered this encounter  Medications  . DISCONTD: HYDROcodone-homatropine (HYCODAN) 5-1.5 MG/5ML syrup    Sig: Take 5 mLs by mouth at bedtime as needed for cough.    Dispense:  120 mL    Refill:  0  . HYDROcodone-homatropine (HYCODAN) 5-1.5 MG/5ML syrup    Sig: Take 5 mLs by mouth at bedtime as needed for cough.    Dispense:  120 mL    Refill:  0    I discussed the assessment and treatment plan with the patient. The patient was provided an opportunity to ask questions and all were answered. The patient agreed with the plan and demonstrated an understanding of the instructions.   The patient was advised to call back or seek an in-person evaluation if the symptoms worsen or if the condition fails to improve as anticipated.   Nani Gasser, MD

## 2019-03-03 NOTE — Progress Notes (Signed)
Pt reports that this cough began Saturday night and this keeps her up. She stated that its a deep cough. Denies any f/s/c/n/v/d.   She has been using Mucinex for the cough and it helps some.Wanda Ortiz, Lahoma Crocker, CMA

## 2019-03-18 ENCOUNTER — Telehealth: Payer: Self-pay | Admitting: Family Medicine

## 2019-03-18 DIAGNOSIS — I10 Essential (primary) hypertension: Secondary | ICD-10-CM

## 2019-03-18 NOTE — Telephone Encounter (Signed)
Routing to Provider for review.  

## 2019-03-18 NOTE — Telephone Encounter (Signed)
Patient called in requesting a continuation of her cough medication. She has no other symptoms but her cough. Same pharmacy  Please advise

## 2019-03-19 MED ORDER — HYDROCODONE-HOMATROPINE 5-1.5 MG/5ML PO SYRP: 5.0000 mL | ORAL_SOLUTION | Freq: Every evening | ORAL | 0 refills | Status: DC | PRN

## 2019-03-19 NOTE — Telephone Encounter (Signed)
Pt advised and voiced understanding.Wanda Ortiz, Wanda Ortiz, CMA

## 2019-03-19 NOTE — Telephone Encounter (Signed)
Med sent.  Please let her know that this cannot be an ongoing prescription because it does have a narcotic in it but I will refill it 1 more time.

## 2019-03-19 NOTE — Addendum Note (Signed)
Addended by: Beatrice Lecher D on: 03/19/2019 10:09 AM   Modules accepted: Orders

## 2019-03-30 ENCOUNTER — Telehealth: Payer: Self-pay | Admitting: Family Medicine

## 2019-03-30 NOTE — Telephone Encounter (Signed)
It is ok to

## 2019-03-30 NOTE — Telephone Encounter (Signed)
Appointment has been made. No further questions at this time.  
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
## 2019-03-31 ENCOUNTER — Ambulatory Visit (INDEPENDENT_AMBULATORY_CARE_PROVIDER_SITE_OTHER): Payer: Self-pay | Admitting: Family Medicine

## 2019-03-31 ENCOUNTER — Other Ambulatory Visit: Payer: Self-pay

## 2019-03-31 ENCOUNTER — Encounter: Payer: Self-pay | Admitting: Family Medicine

## 2019-03-31 ENCOUNTER — Ambulatory Visit: Payer: Medicaid Other

## 2019-03-31 DIAGNOSIS — Z23 Encounter for immunization: Secondary | ICD-10-CM

## 2019-03-31 DIAGNOSIS — S46812A Strain of other muscles, fascia and tendons at shoulder and upper arm level, left arm, initial encounter: Secondary | ICD-10-CM

## 2019-03-31 DIAGNOSIS — I10 Essential (primary) hypertension: Secondary | ICD-10-CM

## 2019-03-31 DIAGNOSIS — M542 Cervicalgia: Secondary | ICD-10-CM
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
## 2019-04-08 ENCOUNTER — Encounter: Payer: Self-pay | Admitting: Physical Therapy

## 2019-04-08 ENCOUNTER — Ambulatory Visit (INDEPENDENT_AMBULATORY_CARE_PROVIDER_SITE_OTHER): Payer: Self-pay | Admitting: Physical Therapy

## 2019-04-08 ENCOUNTER — Other Ambulatory Visit: Payer: Self-pay

## 2019-04-08 DIAGNOSIS — M542 Cervicalgia: Secondary | ICD-10-CM

## 2019-04-08 DIAGNOSIS — M25512 Pain in left shoulder: Secondary | ICD-10-CM

## 2019-04-08 DIAGNOSIS — R252 Cramp and spasm: Secondary | ICD-10-CM

## 2019-04-08 NOTE — Patient Instructions (Signed)
Access Code: [email or web address]  URL: [email or web address]  Date: 04/08/2019  Prepared by: Faustino Congress   Exercises  Seated Scapular Retraction - 10 reps - 1 sets - 5 sec hold - 2x daily - 7x weekly  Seated Cervical Retraction - 10 reps - 1 sets - 5 sec hold - 2x daily - 7x weekly  Seated Cervical Sidebending Stretch - 3 reps - 1 sets - 30 sec hold - 2x daily - 7x weekly  Seated Levator Scapulae Stretch - 1 reps - 1 sets - 30 sec hold - 2x daily - 7x weekly  Patient Education  Trigger Point Dry Needling  TENS Unit
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
## 2019-04-12 ENCOUNTER — Encounter: Payer: Self-pay | Admitting: Physical Therapy

## 2019-04-12 ENCOUNTER — Ambulatory Visit (INDEPENDENT_AMBULATORY_CARE_PROVIDER_SITE_OTHER): Payer: Self-pay | Admitting: Physical Therapy

## 2019-04-12 ENCOUNTER — Other Ambulatory Visit: Payer: Self-pay

## 2019-04-12 DIAGNOSIS — M25512 Pain in left shoulder: Secondary | ICD-10-CM

## 2019-04-12 DIAGNOSIS — M542 Cervicalgia: Secondary | ICD-10-CM

## 2019-04-12 DIAGNOSIS — R252 Cramp and spasm: Secondary | ICD-10-CM

## 2019-04-12 NOTE — Patient Instructions (Signed)
Access Code: [email or web address]  URL: [email or web address]  Date: 04/12/2019  Prepared by: Faustino Congress   Exercises  Seated Scapular Retraction - 10 reps - 1 sets - 5 sec hold - 2x daily - 7x weekly  Seated Cervical Retraction - 10 reps - 1 sets - 5 sec hold - 2x daily - 7x weekly  Seated Cervical Sidebending Stretch - 3 reps - 1 sets - 30 sec hold - 2x daily - 7x weekly  Seated Levator Scapulae Stretch - 1 reps - 1 sets - 30 sec hold - 2x daily - 7x weekly  Scapular Retraction with Resistance - 10 reps - 1 sets - 5 sec hold - 1x daily - 7x weekly  Scapular Retraction with Resistance Advanced - 10 reps - 1 sets - 5 sec hold - 1x daily - 7x weekly  Shoulder External Rotation and Scapular Retraction with Resistance - 10 reps - 1 sets - 5 sec hold - 1x daily - 7x weekly  Standing Shoulder Horizontal Abduction with Resistance - 10 reps - 1 sets - 1-2 sec hold - 1x daily - 7x weekly  Patient Education  Trigger Point Dry Needling  TENS Unit

## 2019-04-12 NOTE — Therapy (Signed)
[facility] [place] [address] [place], [place], [address] Phone: [phone]   Fax:  [phone]  Physical Therapy Treatment  Patient Details  Name: Wanda Ortiz MRN: [id number] Date of Birth: 1961/06/20 Referring Provider (PT): Agapito Games, MD   Encounter Date: 04/12/2019  PT End of Session - 04/12/19 0841    Visit Number  2    Number of Visits  12    Date for PT Re-Evaluation  05/20/19    PT Start Time  0755    PT Stop Time  0835    PT Time Calculation (min)  40 min    Activity Tolerance  Patient tolerated treatment well    Behavior During Therapy  [facility] for tasks assessed/performed       Past Medical History:  Diagnosis Date  . AR (allergic rhinitis)   . Hyperlipemia   . Hypertension   . Migraines     Past Surgical History:  Procedure Laterality Date  . ABDOMINAL HYSTERECTOMY     partial with BSO for Brenners Tumor    There were no vitals filed for this visit.  Subjective Assessment - 04/12/19 0756    Subjective  feels a lot better; minimal pain over the weekend.  compliant with exercises.    Limitations  House hold activities    Patient Stated Goals  improve pain    Currently in Pain?  No/denies    Pain Onset  1 to 4 weeks ago                       [facility] Adult PT Treatment/Exercise - 04/12/19 0757      Exercises   Exercises  Neck      Neck Exercises: Machines for Strengthening   UBE (Upper Arm Bike)  L2 x 4 min (2' each direction)      Neck Exercises: Theraband   Shoulder Extension  Red;10 reps    Shoulder Extension Limitations  5 sec hold    Rows  10 reps;Red    Rows Limitations  5 sec hold    Shoulder External Rotation  10 reps;Red    Horizontal ABduction  10 reps;Red      Neck Exercises: Seated   Neck Retraction  10 reps;10 secs    Other Seated Exercise  scapular retraction 10x5 sec      Neck Exercises: Supine   Neck Retraction  10 reps;10 secs    Capital  Flexion  10 reps;5 secs    Cervical Rotation  Both;10 reps    Other Supine Exercise  scapular retraction 10 x 5 sec      Neck Exercises: Stretches   Upper Trapezius Stretch  Right;Left;3 reps;30 seconds    Levator Stretch  Right;Left;3 reps;30 seconds    Other Neck Stretches  forward ball roll 10 x 10 sec             PT Education - 04/12/19 0841    Education Details  HEP    Person(s) Educated  Patient    Methods  Explanation;Demonstration;Handout    Comprehension  Verbalized understanding;Returned demonstration;Need further instruction          PT Long Term Goals - 04/08/19 1030      PT LONG TERM GOAL #1   Title  independent with HEP    Status  New    Target Date  05/20/19      PT LONG TERM GOAL #2   Title  FOTO score  improved to </= 32% limited for improved function    Status  New    Target Date  05/20/19      PT LONG TERM GOAL #3   Title  perform Lt shoulder ROM without c/o tightness or pain for improved function    Status  New    Target Date  05/20/19      PT LONG TERM GOAL #4   Title  improve cervical rotation to at least 60 deg bil for improved function and ease of driving    Status  New    Target Date  05/20/19      PT LONG TERM GOAL #5   Title  report ability to work full day with pain < 4/10 for improved function    Status  New    Target Date  05/20/19            Plan - 04/12/19 0841    Clinical Impression Statement  Pt reports significant improvement in symptoms since initial eval, and overall reports minimal pain.  Declined need for DN or modalities today. Added strengthening exercises to HEP which she tolerated well today.  May be ready to hold or d/c next 1-2 visit.    Personal Factors and Comorbidities  Comorbidity 2    Comorbidities  migraines, HTN    Examination-Activity Limitations  Reach Overhead;Carry;Caring for Others;Lift    Examination-Participation Restrictions  Cleaning;Other   work related activities   Stability/Clinical  Decision Making  Stable/Uncomplicated    Rehab Potential  Good    PT Frequency  2x / week    PT Duration  6 weeks    PT Treatment/Interventions  ADLs/Self Care Home Management;Cryotherapy;Electrical Stimulation;Ultrasound;Moist Heat;Iontophoresis 4mg /ml Dexamethasone;Functional mobility training;Therapeutic activities;Therapeutic exercise;Patient/family education;Manual techniques;Passive range of motion;Taping;Dry needling    PT Next Visit Plan  if still doing well, look at goals and possible hold or d/c    PT Home Exercise Plan  Access Code: [email or web address]       Patient will benefit from skilled therapeutic intervention in order to improve the following deficits and impairments:  Decreased range of motion, Increased fascial restricitons, Increased muscle spasms, Pain, Postural dysfunction  Visit Diagnosis: Cervicalgia  Acute pain of left shoulder  Cramp and spasm     Problem List Patient Active Problem List   Diagnosis Date Noted  . Nuclear sclerotic cataract of both eyes 10/28/2018  . 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 Retinal edema 10/28/2018  . Hallux rigidus of left foot 06/26/2017  . Macromastia 09/05/2016  . Left elbow pain 07/12/2016  . Left lumbar radiculitis 07/12/2016  . Essential hypertension 02/26/2016  . Hyperlipidemia 03/24/2015  . Rotator cuff syndrome of left shoulder 04/23/2013  . Disorder of bursae and tendons in shoulder region 04/23/2013  . CARPAL TUNNEL SYNDROME 05/03/2009  . POSTMENOPAUSAL STATUS 01/20/2008  . OVARIAN MASS 12/31/2006  . Migraine headache 04/08/2006      Laureen Abrahams, PT, DPT 04/12/19 8:43 AM    [facility], [place], [address] Phone: [phone]   Fax:  [phone]  Name: LANISSA CASHEN MRN: [id number] Date of Birth: 02-07-1961

## 2019-04-14 ENCOUNTER — Ambulatory Visit (INDEPENDENT_AMBULATORY_CARE_PROVIDER_SITE_OTHER): Payer: Self-pay | Admitting: Physical Therapy

## 2019-04-14 ENCOUNTER — Encounter: Payer: Self-pay | Admitting: Physical Therapy

## 2019-04-14 ENCOUNTER — Other Ambulatory Visit: Payer: Self-pay

## 2019-04-14 DIAGNOSIS — M542 Cervicalgia: Secondary | ICD-10-CM

## 2019-04-14 DIAGNOSIS — R252 Cramp and spasm: Secondary | ICD-10-CM

## 2019-04-14 DIAGNOSIS — M25512 Pain in left shoulder: Secondary | ICD-10-CM

## 2019-04-14 NOTE — Therapy (Addendum)
[place], [place], [address] Phone: [phone]   Fax:  [phone]  Physical Therapy Treatment/Discharge Summary  Patient Details  Name: Wanda Ortiz MRN: [id number] Date of Birth: 06/04/1961 Referring Provider (PT): Hali Marry, MD   Encounter Date: 04/14/2019  PT End of Session - 04/14/19 0800    Visit Number  3    Number of Visits  12    Date for PT Re-Evaluation  05/20/19    PT Start Time  0805    PT Stop Time  0539    PT Time Calculation (min)  29 min    Activity Tolerance  Patient tolerated treatment well;No increased pain       Past Medical History:  Diagnosis Date  . AR (allergic rhinitis)   . Hyperlipemia   . Hypertension   . Migraines     Past Surgical History:  Procedure Laterality Date  . ABDOMINAL HYSTERECTOMY     partial with BSO for [facility] Tumor    There were no vitals filed for this visit.  Subjective Assessment - 04/14/19 0800    Subjective  Pt states that she is doing better and feels that she has recovered from her injury. Pt states that she has been keeping up with the exercises on her HEP.    Currently in Pain?  No/denies         [facility] PT Assessment - 04/14/19 0001      Assessment   Medical Diagnosis  S46.812A (ICD-10-CM) - Trapezius muscle strain, left, initial encounter; M54.2 (ICD-10-CM) - Cervical pain    Referring Provider (PT)  Hali Marry, MD    Onset Date/Surgical Date  03/28/19    Hand Dominance  Right    Next MD Visit  05/24/19    Prior Therapy  at this clinic in 2014      Observation/Other Assessments   Focus on Therapeutic Outcomes (FOTO)   21% limitation       AROM   Overall AROM Comments  bil shoulder ROM WNL   without pain   Cervical - Right Rotation  55    Cervical - Left Rotation  65      [facility] Adult PT Treatment/Exercise - 04/14/19 0001      Self-Care   Other Self-Care Comments   Pt instructed in  self massage to Lt pec and periscapular muscles with ball; pt returned demo with cues.       Neck Exercises: Machines for Strengthening   UBE (Upper Arm Bike)  L2 86mn (1 1/2 min each direction)      Neck Exercises: Theraband   Shoulder Extension  Red;10 reps    Rows  10 reps;Red    Shoulder External Rotation  10 reps;Red    Horizontal ABduction  10 reps;Red      Neck Exercises: Standing   Other Standing Exercises  scap squeeze x 5 sec x 10 reps    Other Standing Exercises  chin tucks x 3 sec x 10, tactile cues to correct form       Neck Exercises: Stretches   Upper Trapezius Stretch  Right;1 rep    Levator Stretch  Right;1 rep    Other Neck Stretches  mid level doorway stretch x 20 sec x 2, bilat bicep stretch on doorway x 20 sec x 2 - shown corner stretch for alternative; pt returned demo.  PT Long Term Goals - 04/14/19 0901      PT LONG TERM GOAL #1   Title  independent with HEP    Status  Achieved      PT LONG TERM GOAL #2   Title  FOTO score improved to </= 32% limited for improved function    Baseline  21% limited    Status  Achieved      PT LONG TERM GOAL #3   Title  perform Lt shoulder ROM without c/o tightness or pain for improved function    Status  Achieved      PT LONG TERM GOAL #4   Title  improve cervical rotation to at least 60 deg bil for improved function and ease of driving    Status  Partially Met      PT LONG TERM GOAL #5   Title  report ability to work full day with pain < 4/10 for improved function    Status  Achieved            Plan - 04/14/19 0844    Clinical Impression Statement  Pt reporting overall improvement in Lt shoulder/neck pain.  Pt required minor cues on HEP form; added stretches today without difficulty.  Pt has met all but one goal; requests to d/c at this time. Pt satisfied with current level of function.    Personal Factors and Comorbidities  Comorbidity 2    Comorbidities  migraines, HTN     Examination-Activity Limitations  Reach Overhead;Carry;Caring for Others;Lift    Examination-Participation Restrictions  Cleaning;Other   work related activities   Stability/Clinical Decision Making  Stable/Uncomplicated    Rehab Potential  Good    PT Frequency  2x / week    PT Duration  6 weeks    PT Treatment/Interventions  ADLs/Self Care Home Management;Cryotherapy;Electrical Stimulation;Ultrasound;Moist Heat;Iontophoresis '4mg'$ /ml Dexamethasone;Functional mobility training;Therapeutic activities;Therapeutic exercise;Patient/family education;Manual techniques;Passive range of motion;Taping;Dry needling    PT Next Visit Plan Will d/c per pt request.     PT Home Exercise Plan  Access Code: [email or web address]       Patient will benefit from skilled therapeutic intervention in order to improve the following deficits and impairments:  Decreased range of motion, Increased fascial restricitons, Increased muscle spasms, Pain, Postural dysfunction  Visit Diagnosis: Cervicalgia  Acute pain of left shoulder  Cramp and spasm     Problem List Patient Active Problem List   Diagnosis Date Noted  . Nuclear sclerotic cataract of both eyes 10/28/2018  . 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 Retinal edema 10/28/2018  . Hallux rigidus of left foot 06/26/2017  . Macromastia 09/05/2016  . Left elbow pain 07/12/2016  . Left lumbar radiculitis 07/12/2016  . Essential hypertension 02/26/2016  . Hyperlipidemia 03/24/2015  . Rotator cuff syndrome of left shoulder 04/23/2013  . Disorder of bursae and tendons in shoulder region 04/23/2013  . CARPAL TUNNEL SYNDROME 05/03/2009  . POSTMENOPAUSAL STATUS 01/20/2008  . OVARIAN MASS 12/31/2006  . Migraine headache 04/08/2006   Kerin Perna, PTA 04/14/19 9:15 AM  [place], [place], [address] Phone: [phone]   Fax:  [phone]  Name:  Wanda Ortiz MRN: [id number] Date of Birth: October 21, 1960      PHYSICAL THERAPY DISCHARGE SUMMARY  Visits from Start of Care: 3  Current functional level related to goals / functional outcomes: See above   Remaining deficits: See above   Education / Equipment: HEP  Plan: Patient  agrees to discharge.  Patient goals were met. Patient is being discharged due to meeting the stated rehab goals.  ?????    Laureen Abrahams, PT, DPT 04/14/19 10:18 AM  [facility] [place],  [address]  [phone] (office) [phone] (fax)

## 2019-04-15 ENCOUNTER — Ambulatory Visit: Payer: Medicaid Other

## 2019-04-19 ENCOUNTER — Encounter: Payer: Medicaid Other | Admitting: Physical Therapy

## 2019-04-21 ENCOUNTER — Encounter: Payer: Medicaid Other | Admitting: Physical Therapy

## 2019-04-22 ENCOUNTER — Ambulatory Visit: Payer: Medicaid Other

## 2019-04-26 ENCOUNTER — Encounter: Payer: Medicaid Other | Admitting: Physical Therapy

## 2019-04-28 ENCOUNTER — Encounter: Payer: Medicaid Other | Admitting: Physical Therapy

## 2019-05-24 ENCOUNTER — Ambulatory Visit: Payer: Self-pay | Admitting: Family Medicine

## 2019-05-24 NOTE — Progress Notes (Deleted)
Established Patient Office Visit  Subjective:  Patient ID: Wanda Ortiz, female    DOB: April 03, 1961  Age: 58 y.o. MRN: [id number]  CC: No chief complaint on file.   HPI Wanda Ortiz presents for   Hypertension- Pt denies chest pain, SOB, dizziness, or heart palpitations.  Taking meds as directed w/o problems.  Denies medication side effects.      Past Medical History:  Diagnosis Date  . AR (allergic rhinitis)   . Hyperlipemia   . Hypertension   . Migraines     Past Surgical History:  Procedure Laterality Date  . ABDOMINAL HYSTERECTOMY     partial with BSO for Brenners Tumor    Family History  Problem Relation Age of Onset  . Diabetes Mother   . Hyperlipidemia Mother   . Hypertension Father   . Cancer Father        prostate    Social History   Socioeconomic History  . Marital status: Single    Spouse name: Not on file  . Number of children: Not on file  . Years of education: Not on file  . Highest education level: Not on file  Occupational History  . Not on file  Social Needs  . Financial resource strain: Not on file  . Food insecurity    Worry: Not on file    Inability: Not on file  . Transportation needs    Medical: Not on file    Non-medical: Not on file  Tobacco Use  . Smoking status: Former Smoker    Packs/day: 0.30    Years: 15.00    Pack years: 4.50    Types: Cigarettes    Quit date: 11/30/2018    Years since quitting: 0.4  . Smokeless tobacco: Never Used  Substance and Sexual Activity  . Alcohol use: Yes    Alcohol/week: 2.0 standard drinks    Types: 2 Standard drinks or equivalent per week  . Drug use: No  . Sexual activity: Yes  Lifestyle  . Physical activity    Days per week: Not on file    Minutes per session: Not on file  . Stress: Not on file  Relationships  . Social Musician on phone: Not on file    Gets together: Not on file    Attends religious service: Not on file    Active member of club or  organization: Not on file    Attends meetings of clubs or organizations: Not on file    Relationship status: Not on file  . Intimate partner violence    Fear of current or ex partner: Not on file    Emotionally abused: Not on file    Physically abused: Not on file    Forced sexual activity: Not on file  Other Topics Concern  . Not on file  Social History Narrative  . Not on file    Outpatient Medications Prior to Visit  Medication Sig Dispense Refill  . cetirizine-pseudoephedrine (ZYRTEC-D) 5-120 MG per tablet Take 1 tablet by mouth daily as needed.     . diclofenac sodium (VOLTAREN) 1 % GEL 2 gm to knee as needed for pain    . folic acid (FOLVITE) 1 MG tablet Take by mouth daily.     . hydrochlorothiazide (MICROZIDE) 12.5 MG capsule Take 1 capsule (12.5 mg total) by mouth daily. 90 capsule 3  . ibuprofen (ADVIL) 800 MG tablet Take 1 tablet by mouth every 8 (eight) hours as needed.    Marland Kitchen  methotrexate (RHEUMATREX) 2.5 MG tablet Take 4 tablets by mouth once a week.    . naratriptan (AMERGE) 2.5 MG tablet Take 1 tablet (2.5 mg total) by mouth as needed. Take one (1) tablet at onset of headache; if returns or does not resolve, may repeat after 4 hours; do not exceed five (5) mg in 24 hours. 10 tablet PRN  . pravastatin (PRAVACHOL) 40 MG tablet Take 1 tablet (40 mg total) by mouth daily. 90 tablet 3   No facility-administered medications prior to visit.     Allergies  Allergen Reactions  . Fish Allergy Hives  . Shellfish Allergy Hives    ROS Review of Systems    Objective:    Physical Exam  There were no vitals taken for this visit. Wt Readings from Last 3 Encounters:  03/31/19 210 lb (95.3 kg)  03/03/19 205 lb (93 kg)  01/09/18 180 lb (81.6 kg)     Health Maintenance Due  Topic Date Due  . MAMMOGRAM  03/29/2018    There are no preventive care reminders to display for this patient.  Lab Results  Component Value Date   TSH 0.28 (L) 03/19/2016   Lab Results   Component Value Date   WBC 8.7 04/14/2018   HGB 13.7 04/14/2018   HCT 42 04/14/2018   MCV 85.7 03/22/2015   PLT 198 04/14/2018   Lab Results  Component Value Date   NA 144 04/14/2018   K 3.1 (A) 04/14/2018   CO2 27 03/19/2016   GLUCOSE 89 03/19/2016   BUN 12 04/14/2018   CREATININE 0.8 04/14/2018   BILITOT 0.4 03/19/2016   ALKPHOS 119 04/14/2018   AST 21 04/14/2018   ALT 15 04/14/2018   PROT 6.8 03/19/2016   ALBUMIN 4.0 03/19/2016   CALCIUM 8.8 03/19/2016   Lab Results  Component Value Date   CHOL 260 (H) 03/19/2016   Lab Results  Component Value Date   HDL 41 (L) 03/19/2016   Lab Results  Component Value Date   LDLCALC NOT CALC 03/19/2016   Lab Results  Component Value Date   TRIG 409 (H) 03/19/2016   Lab Results  Component Value Date   CHOLHDL 6.3 (H) 03/19/2016   No results found for: HGBA1C    Assessment & Plan:   Problem List Items Addressed This Visit      Cardiovascular and Mediastinum   Essential hypertension - Primary     Other   Hyperlipidemia      No orders of the defined types were placed in this encounter.   Follow-up: No follow-ups on file.    Beatrice Lecher, MD

## 2019-05-25 ENCOUNTER — Ambulatory Visit: Payer: Medicaid Other | Admitting: Family Medicine

## 2019-05-25 NOTE — Progress Notes (Deleted)
Established Patient Office Visit  Subjective:  Patient ID: Wanda Ortiz, female    DOB: March 18, 1961  Age: 58 y.o. MRN: [id number]  CC: No chief complaint on file.   HPI Wanda Ortiz presents for 6 mo f/u:   Hypertension- Pt denies chest pain, SOB, dizziness, or heart palpitations.  Taking meds as directed w/o problems.  Denies medication side effects.      Past Medical History:  Diagnosis Date  . AR (allergic rhinitis)   . Hyperlipemia   . Hypertension   . Migraines     Past Surgical History:  Procedure Laterality Date  . ABDOMINAL HYSTERECTOMY     partial with BSO for Brenners Tumor    Family History  Problem Relation Age of Onset  . Diabetes Mother   . Hyperlipidemia Mother   . Hypertension Father   . Cancer Father        prostate    Social History   Socioeconomic History  . Marital status: Single    Spouse name: Not on file  . Number of children: Not on file  . Years of education: Not on file  . Highest education level: Not on file  Occupational History  . Not on file  Social Needs  . Financial resource strain: Not on file  . Food insecurity    Worry: Not on file    Inability: Not on file  . Transportation needs    Medical: Not on file    Non-medical: Not on file  Tobacco Use  . Smoking status: Former Smoker    Packs/day: 0.30    Years: 15.00    Pack years: 4.50    Types: Cigarettes    Quit date: 11/30/2018    Years since quitting: 0.4  . Smokeless tobacco: Never Used  Substance and Sexual Activity  . Alcohol use: Yes    Alcohol/week: 2.0 standard drinks    Types: 2 Standard drinks or equivalent per week  . Drug use: No  . Sexual activity: Yes  Lifestyle  . Physical activity    Days per week: Not on file    Minutes per session: Not on file  . Stress: Not on file  Relationships  . Social Herbalist on phone: Not on file    Gets together: Not on file    Attends religious service: Not on file    Active member of  club or organization: Not on file    Attends meetings of clubs or organizations: Not on file    Relationship status: Not on file  . Intimate partner violence    Fear of current or ex partner: Not on file    Emotionally abused: Not on file    Physically abused: Not on file    Forced sexual activity: Not on file  Other Topics Concern  . Not on file  Social History Narrative  . Not on file    Outpatient Medications Prior to Visit  Medication Sig Dispense Refill  . cetirizine-pseudoephedrine (ZYRTEC-D) 5-120 MG per tablet Take 1 tablet by mouth daily as needed.     . diclofenac sodium (VOLTAREN) 1 % GEL 2 gm to knee as needed for pain    . folic acid (FOLVITE) 1 MG tablet Take by mouth daily.     . hydrochlorothiazide (MICROZIDE) 12.5 MG capsule Take 1 capsule (12.5 mg total) by mouth daily. 90 capsule 3  . ibuprofen (ADVIL) 800 MG tablet Take 1 tablet by mouth every 8 (eight)  hours as needed.    . methotrexate (RHEUMATREX) 2.5 MG tablet Take 4 tablets by mouth once a week.    . naratriptan (AMERGE) 2.5 MG tablet Take 1 tablet (2.5 mg total) by mouth as needed. Take one (1) tablet at onset of headache; if returns or does not resolve, may repeat after 4 hours; do not exceed five (5) mg in 24 hours. 10 tablet PRN  . pravastatin (PRAVACHOL) 40 MG tablet Take 1 tablet (40 mg total) by mouth daily. 90 tablet 3   No facility-administered medications prior to visit.     Allergies  Allergen Reactions  . Fish Allergy Hives  . Shellfish Allergy Hives    ROS Review of Systems    Objective:    Physical Exam  There were no vitals taken for this visit. Wt Readings from Last 3 Encounters:  03/31/19 210 lb (95.3 kg)  03/03/19 205 lb (93 kg)  01/09/18 180 lb (81.6 kg)     Health Maintenance Due  Topic Date Due  . MAMMOGRAM  03/29/2018    There are no preventive care reminders to display for this patient.  Lab Results  Component Value Date   TSH 0.28 (L) 03/19/2016   Lab  Results  Component Value Date   WBC 8.7 04/14/2018   HGB 13.7 04/14/2018   HCT 42 04/14/2018   MCV 85.7 03/22/2015   PLT 198 04/14/2018   Lab Results  Component Value Date   NA 144 04/14/2018   K 3.1 (A) 04/14/2018   CO2 27 03/19/2016   GLUCOSE 89 03/19/2016   BUN 12 04/14/2018   CREATININE 0.8 04/14/2018   BILITOT 0.4 03/19/2016   ALKPHOS 119 04/14/2018   AST 21 04/14/2018   ALT 15 04/14/2018   PROT 6.8 03/19/2016   ALBUMIN 4.0 03/19/2016   CALCIUM 8.8 03/19/2016   Lab Results  Component Value Date   CHOL 260 (H) 03/19/2016   Lab Results  Component Value Date   HDL 41 (L) 03/19/2016   Lab Results  Component Value Date   LDLCALC NOT CALC 03/19/2016   Lab Results  Component Value Date   TRIG 409 (H) 03/19/2016   Lab Results  Component Value Date   CHOLHDL 6.3 (H) 03/19/2016   No results found for: HGBA1C    Assessment & Plan:   Problem List Items Addressed This Visit      Cardiovascular and Mediastinum   Essential hypertension - Primary      No orders of the defined types were placed in this encounter.   Follow-up: No follow-ups on file.    Nani Gasser, MD

## 2019-07-06 ENCOUNTER — Telehealth: Payer: Self-pay | Admitting: Family Medicine

## 2019-07-06 NOTE — Telephone Encounter (Signed)
Patient calling in wanting to be seen for a hospital follow up. Wanted a virtual visit. States [facility] told her to follow up this week. Still not feeling good. Seen for throwing up, face in pain. Please advice, no open slots.

## 2019-07-06 NOTE — Telephone Encounter (Signed)
Okay to use acute slot for tomorrow.  See if she at least has some nausea medicine until we can do her an appointment tomorrow.

## 2019-07-07 NOTE — Telephone Encounter (Signed)
Attempted to call patient at 9:19am to schedule an appt, no answer/no voicemail to leave a message. AM

## 2019-08-04 ENCOUNTER — Encounter: Payer: Self-pay | Admitting: Family Medicine

## 2019-08-04 ENCOUNTER — Ambulatory Visit (INDEPENDENT_AMBULATORY_CARE_PROVIDER_SITE_OTHER): Payer: Self-pay | Admitting: Family Medicine

## 2019-08-04 VITALS — BP 158/94 | HR 83 | Ht 66.0 in | Wt 205.0 lb

## 2019-08-04 DIAGNOSIS — R1013 Epigastric pain: Secondary | ICD-10-CM

## 2019-08-04 DIAGNOSIS — R110 Nausea: Secondary | ICD-10-CM

## 2019-08-04 DIAGNOSIS — I10 Essential (primary) hypertension: Secondary | ICD-10-CM

## 2019-08-04 DIAGNOSIS — R142 Eructation: Secondary | ICD-10-CM

## 2019-08-04 DIAGNOSIS — E785 Hyperlipidemia, unspecified: Secondary | ICD-10-CM

## 2019-08-04 LAB — CBC
HCT: 43.7 % (ref 35.0–45.0)
Hemoglobin: 14.7 g/dL (ref 11.7–15.5)
MCH: 28.5 pg (ref 27.0–33.0)
MCHC: 33.6 g/dL (ref 32.0–36.0)
MCV: 84.7 fL (ref 80.0–100.0)
MPV: 10.3 fL (ref 7.5–12.5)
Platelets: 225 10*3/uL (ref 140–400)
RBC: 5.16 10*6/uL — ABNORMAL HIGH (ref 3.80–5.10)
RDW: 13.7 % (ref 11.0–15.0)
WBC: 7.1 10*3/uL (ref 3.8–10.8)

## 2019-08-04 LAB — COMPLETE METABOLIC PANEL WITH GFR
AG Ratio: 1.6 (calc) (ref 1.0–2.5)
ALT: 19 U/L (ref 6–29)
AST: 21 U/L (ref 10–35)
Albumin: 4.2 g/dL (ref 3.6–5.1)
Alkaline phosphatase (APISO): 103 U/L (ref 37–153)
BUN: 10 mg/dL (ref 7–25)
CO2: 32 mmol/L (ref 20–32)
Calcium: 9.7 mg/dL (ref 8.6–10.4)
Chloride: 109 mmol/L (ref 98–110)
Creat: 0.82 mg/dL (ref 0.50–1.05)
GFR, Est African American: 91 mL/min/{1.73_m2} (ref >=60)
GFR, Est Non African American: 79 mL/min/{1.73_m2} (ref >=60)
Globulin: 2.7 g/dL (calc) (ref 1.9–3.7)
Glucose, Bld: 98 mg/dL (ref 65–99)
Potassium: 3.9 mmol/L (ref 3.5–5.3)
Sodium: 147 mmol/L — ABNORMAL HIGH (ref 135–146)
Total Bilirubin: 0.4 mg/dL (ref 0.2–1.2)
Total Protein: 6.9 g/dL (ref 6.1–8.1)

## 2019-08-04 LAB — LIPASE: Lipase: 19 U/L (ref 7–60)

## 2019-08-04 MED ORDER — HYDROCHLOROTHIAZIDE 25 MG PO TABS: 25.0000 mg | ORAL_TABLET | Freq: Every day | ORAL | 0 refills | Status: DC

## 2019-08-04 MED ORDER — SUCRALFATE 1 G PO TABS: 1.0000 g | ORAL_TABLET | Freq: Three times a day (TID) | ORAL | 0 refills | Status: DC

## 2019-08-04 NOTE — Progress Notes (Signed)
Acute Office Visit  Subjective:    Patient ID: Wanda Ortiz, female    DOB: 10-13-1960, 59 y.o.   MRN: [id number]  Chief Complaint  Patient presents with  . Abdominal Pain    HPI Patient is in today for Pt reports that her sxs began 2 days ago after eating lunch. She said that her stomach is tender/sore and the pain is above her belly button. Also said that she has been experiencing more acid reflux and belching. She had tried Pepcid and this didn't help her sxs. She has been drinking more ginger ale. Denies any f/s/c/n/v/d. Last BM was normal.   He still has her gallbladder and as far as she is aware she is never had any problems with it. Interestingly both of her daughters and one of her sister, have had to have their gallbladder removed  Past Medical History:  Diagnosis Date  . AR (allergic rhinitis)   . Hyperlipemia   . Hypertension   . Migraines     Past Surgical History:  Procedure Laterality Date  . ABDOMINAL HYSTERECTOMY     partial with BSO for Brenners Tumor    Family History  Problem Relation Age of Onset  . Diabetes Mother   . Hyperlipidemia Mother   . Hypertension Father   . Cancer Father        prostate    Social History   Socioeconomic History  . Marital status: Single    Spouse name: Not on file  . Number of children: Not on file  . Years of education: Not on file  . Highest education level: Not on file  Occupational History  . Not on file  Tobacco Use  . Smoking status: Former Smoker    Packs/day: 0.30    Years: 15.00    Pack years: 4.50    Types: Cigarettes    Quit date: 11/30/2018    Years since quitting: 0.6  . Smokeless tobacco: Never Used  Substance and Sexual Activity  . Alcohol use: Yes    Alcohol/week: 2.0 standard drinks    Types: 2 Standard drinks or equivalent per week  . Drug use: No  . Sexual activity: Yes  Other Topics Concern  . Not on file  Social History Narrative  . Not on file   Social Determinants of Health    Financial Resource Strain:   . Difficulty of Paying Living Expenses: Not on file  Food Insecurity:   . Worried About Programme researcher, broadcasting/film/video in the Last Year: Not on file  . Ran Out of Food in the Last Year: Not on file  Transportation Needs:   . Lack of Transportation (Medical): Not on file  . Lack of Transportation (Non-Medical): Not on file  Physical Activity:   . Days of Exercise per Week: Not on file  . Minutes of Exercise per Session: Not on file  Stress:   . Feeling of Stress : Not on file  Social Connections:   . Frequency of Communication with Friends and Family: Not on file  . Frequency of Social Gatherings with Friends and Family: Not on file  . Attends Religious Services: Not on file  . Active Member of Clubs or Organizations: Not on file  . Attends Banker Meetings: Not on file  . Marital Status: Not on file  Intimate Partner Violence:   . Fear of Current or Ex-Partner: Not on file  . Emotionally Abused: Not on file  . Physically Abused: Not on  file  . Sexually Abused: Not on file    Outpatient Medications Prior to Visit  Medication Sig Dispense Refill  . cetirizine-pseudoephedrine (ZYRTEC-D) 5-120 MG per tablet Take 1 tablet by mouth daily as needed.     . diclofenac sodium (VOLTAREN) 1 % GEL 2 gm to knee as needed for pain    . folic acid (FOLVITE) 1 MG tablet Take by mouth daily.     Marland Kitchen ibuprofen (ADVIL) 800 MG tablet Take 1 tablet by mouth every 8 (eight) hours as needed.    . methotrexate (RHEUMATREX) 2.5 MG tablet Take 4 tablets by mouth once a week.    . naratriptan (AMERGE) 2.5 MG tablet Take 1 tablet (2.5 mg total) by mouth as needed. Take one (1) tablet at onset of headache; if returns or does not resolve, may repeat after 4 hours; do not exceed five (5) mg in 24 hours. 10 tablet PRN  . pravastatin (PRAVACHOL) 40 MG tablet Take 1 tablet (40 mg total) by mouth daily. 90 tablet 3  . hydrochlorothiazide (MICROZIDE) 12.5 MG capsule Take 1 capsule  (12.5 mg total) by mouth daily. 90 capsule 3   No facility-administered medications prior to visit.    Allergies  Allergen Reactions  . Fish Allergy Hives  . Shellfish Allergy Hives    Review of Systems     Objective:    Physical Exam Constitutional:      Appearance: She is well-developed.  HENT:     Head: Normocephalic and atraumatic.  Cardiovascular:     Rate and Rhythm: Normal rate and regular rhythm.     Heart sounds: Normal heart sounds.  Pulmonary:     Effort: Pulmonary effort is normal.     Breath sounds: Normal breath sounds.  Abdominal:     General: Abdomen is flat. Bowel sounds are increased.     Palpations: Abdomen is soft.     Tenderness: There is abdominal tenderness in the epigastric area. There is no guarding or rebound.  Skin:    General: Skin is warm and dry.  Neurological:     Mental Status: She is alert and oriented to person, place, and time.  Psychiatric:        Behavior: Behavior normal.     BP (!) 158/94   Pulse 83   Ht 5\' 6"  (1.676 m)   Wt 205 lb (93 kg)   SpO2 98%   BMI 33.09 kg/m  Wt Readings from Last 3 Encounters:  08/04/19 205 lb (93 kg)  03/31/19 210 lb (95.3 kg)  03/03/19 205 lb (93 kg)    Health Maintenance Due  Topic Date Due  . MAMMOGRAM  03/29/2018    There are no preventive care reminders to display for this patient.   Lab Results  Component Value Date   TSH 0.28 (L) 03/19/2016   Lab Results  Component Value Date   WBC 8.7 04/14/2018   HGB 13.7 04/14/2018   HCT 42 04/14/2018   MCV 85.7 03/22/2015   PLT 198 04/14/2018   Lab Results  Component Value Date   NA 144 04/14/2018   K 3.1 (A) 04/14/2018   CO2 27 03/19/2016   GLUCOSE 89 03/19/2016   BUN 12 04/14/2018   CREATININE 0.8 04/14/2018   BILITOT 0.4 03/19/2016   ALKPHOS 119 04/14/2018   AST 21 04/14/2018   ALT 15 04/14/2018   PROT 6.8 03/19/2016   ALBUMIN 4.0 03/19/2016   CALCIUM 8.8 03/19/2016   Lab Results  Component Value Date  CHOL 260  (H) 03/19/2016   Lab Results  Component Value Date   HDL 41 (L) 03/19/2016   Lab Results  Component Value Date   LDLCALC NOT CALC 03/19/2016   Lab Results  Component Value Date   TRIG 409 (H) 03/19/2016   Lab Results  Component Value Date   CHOLHDL 6.3 (H) 03/19/2016   No results found for: HGBA1C     Assessment & Plan:   Problem List Items Addressed This Visit      Cardiovascular and Mediastinum   Essential hypertension - Primary   Relevant Medications   hydrochlorothiazide (HYDRODIURIL) 25 MG tablet    Other Visit Diagnoses    Epigastric pain       Relevant Orders   US Abdomen Complete   CBC   COMPLETE METABOLIC PANEL WITH GFR   Lipase   Nausea       Relevant Orders   CBC   COMPLETE METABOLIC PANEL WITH GFR   Lipase   Belching       Relevant Orders   CBC   COMPLETE METABOLIC PANEL WITH GFR   Lipase     Epigastric pain-unclear etiology.  Could just be gastritis versus H. pylori versus cholecystitis versus pancreatitis.  We will start with labs and schedule for ultrasound.  If not improved by Monday then will get H. pylori testing.  She did take Pepcid for a couple of days so that could interfere with testing if we do it too soon.  Belching-likely GERD related but not responding to Pepcid.  We will do a trial of Carafate just in case we need to do H. pylori testing.  Hypertension-not well controlled today though she says she forgot to take her medication.  Urged her to take her medication regularly and we can always recheck in a couple of weeks.  We will up her HCTZ to 25 mg.  New prescription sent to pharmacy.  Nausea-we will start with additional work-up and a trial of Carafate.  If not helping then consider addition of Zofran.  Meds ordered this encounter  Medications  . sucralfate (CARAFATE) 1 g tablet    Sig: Take 1 tablet (1 g total) by mouth 4 (four) times daily -  with meals and at bedtime.    Dispense:  120 tablet    Refill:  0  .  hydrochlorothiazide (HYDRODIURIL) 25 MG tablet    Sig: Take 1 tablet (25 mg total) by mouth daily.    Dispense:  90 tablet    Refill:  0     Beatrice Lecher, MD

## 2019-08-04 NOTE — Progress Notes (Signed)
  Pt reports that her sxs began 2 days ago after eating lunch. She said that her stomach is tender/sore and the pain is above her belly button. Also said that she has been experiencing more acid reflux and belching. She had tried Pepcid and this didn't help her sxs. She has been drinking more ginger ale.   Denies any f/s/c/n/v/d. Last BM was normal.

## 2019-08-05 ENCOUNTER — Other Ambulatory Visit: Payer: Self-pay

## 2019-08-05 ENCOUNTER — Ambulatory Visit (INDEPENDENT_AMBULATORY_CARE_PROVIDER_SITE_OTHER): Payer: Self-pay

## 2019-08-05 DIAGNOSIS — R1013 Epigastric pain: Secondary | ICD-10-CM

## 2019-08-06 ENCOUNTER — Other Ambulatory Visit: Payer: Medicaid Other

## 2019-08-06 ENCOUNTER — Encounter: Payer: Self-pay | Admitting: Family Medicine

## 2019-08-06 DIAGNOSIS — K760 Fatty (change of) liver, not elsewhere classified: Secondary | ICD-10-CM | POA: Insufficient documentation

## 2019-08-06 MED ORDER — PANTOPRAZOLE SODIUM 40 MG PO TBEC: 40.0000 mg | DELAYED_RELEASE_TABLET | Freq: Two times a day (BID) | ORAL | 1 refills | Status: DC

## 2019-08-09 MED ORDER — PRAVASTATIN SODIUM 40 MG PO TABS: 40.0000 mg | ORAL_TABLET | Freq: Every day | ORAL | 3 refills | Status: DC

## 2019-08-09 NOTE — Addendum Note (Signed)
Addended by: Arvilla Market on: 08/09/2019 11:31 AM   Modules accepted: Orders

## 2019-08-23 ENCOUNTER — Other Ambulatory Visit: Payer: Self-pay

## 2019-08-23 ENCOUNTER — Ambulatory Visit (INDEPENDENT_AMBULATORY_CARE_PROVIDER_SITE_OTHER): Payer: Medicaid Other | Admitting: Family Medicine

## 2019-08-23 ENCOUNTER — Encounter: Payer: Self-pay | Admitting: Family Medicine

## 2019-08-23 ENCOUNTER — Ambulatory Visit (INDEPENDENT_AMBULATORY_CARE_PROVIDER_SITE_OTHER): Payer: Self-pay

## 2019-08-23 VITALS — BP 140/74 | HR 89 | Ht 66.0 in | Wt 204.0 lb

## 2019-08-23 DIAGNOSIS — M79672 Pain in left foot: Secondary | ICD-10-CM

## 2019-08-23 NOTE — Patient Instructions (Addendum)
Okay to use ibuprofen or Aleve for pain relief and for inflammation.  Try to elevate foot for 15 to 20 minutes twice a day if possible.  And ice as needed.  We will get you in with one of our sports med docs if your foot is not improving over the next 2 to 3 weeks with the boot.

## 2019-08-23 NOTE — Progress Notes (Signed)
Acute Office Visit  Subjective:    Patient ID: Wanda Ortiz, female    DOB: 05-09-1961, 59 y.o.   MRN: [id number]  Chief Complaint  Patient presents with  . Foot Pain    L foot got worse on wednesday. hx of foot surgery    HPI Patient is in today for Left foot pain. Hx of prior foot surgery.  Says starting early last week and was swollen and painful.  No redness or hot to touch. Painful to walk on it at the end of the week.  She does have decreased range of motion in that great toe but that is not new.  She says she has had swelling before and with put on her cam walker boot and usually get relief after about a week.  She could not find her boot at this time or even a postop shoe.  Past Medical History:  Diagnosis Date  . AR (allergic rhinitis)   . Hyperlipemia   . Hypertension   . Migraines     Past Surgical History:  Procedure Laterality Date  . ABDOMINAL HYSTERECTOMY     partial with BSO for Brenners Tumor    Family History  Problem Relation Age of Onset  . Diabetes Mother   . Hyperlipidemia Mother   . Hypertension Father   . Cancer Father        prostate    Social History   Socioeconomic History  . Marital status: Single    Spouse name: Not on file  . Number of children: Not on file  . Years of education: Not on file  . Highest education level: Not on file  Occupational History  . Not on file  Tobacco Use  . Smoking status: Former Smoker    Packs/day: 0.30    Years: 15.00    Pack years: 4.50    Types: Cigarettes    Quit date: 11/30/2018    Years since quitting: 0.7  . Smokeless tobacco: Never Used  Substance and Sexual Activity  . Alcohol use: Yes    Alcohol/week: 2.0 standard drinks    Types: 2 Standard drinks or equivalent per week  . Drug use: No  . Sexual activity: Yes  Other Topics Concern  . Not on file  Social History Narrative  . Not on file   Social Determinants of Health   Financial Resource Strain:   . Difficulty of Paying  Living Expenses: Not on file  Food Insecurity:   . Worried About Programme researcher, broadcasting/film/video in the Last Year: Not on file  . Ran Out of Food in the Last Year: Not on file  Transportation Needs:   . Lack of Transportation (Medical): Not on file  . Lack of Transportation (Non-Medical): Not on file  Physical Activity:   . Days of Exercise per Week: Not on file  . Minutes of Exercise per Session: Not on file  Stress:   . Feeling of Stress : Not on file  Social Connections:   . Frequency of Communication with Friends and Family: Not on file  . Frequency of Social Gatherings with Friends and Family: Not on file  . Attends Religious Services: Not on file  . Active Member of Clubs or Organizations: Not on file  . Attends Banker Meetings: Not on file  . Marital Status: Not on file  Intimate Partner Violence:   . Fear of Current or Ex-Partner: Not on file  . Emotionally Abused: Not on file  .  Physically Abused: Not on file  . Sexually Abused: Not on file    Outpatient Medications Prior to Visit  Medication Sig Dispense Refill  . cetirizine-pseudoephedrine (ZYRTEC-D) 5-120 MG per tablet Take 1 tablet by mouth daily as needed.     . diclofenac sodium (VOLTAREN) 1 % GEL 2 gm to knee as needed for pain    . folic acid (FOLVITE) 1 MG tablet Take by mouth daily.     . hydrochlorothiazide (HYDRODIURIL) 25 MG tablet Take 1 tablet (25 mg total) by mouth daily. 90 tablet 0  . ibuprofen (ADVIL) 800 MG tablet Take 1 tablet by mouth every 8 (eight) hours as needed.    . methotrexate (RHEUMATREX) 2.5 MG tablet Take 4 tablets by mouth once a week.    . naratriptan (AMERGE) 2.5 MG tablet Take 1 tablet (2.5 mg total) by mouth as needed. Take one (1) tablet at onset of headache; if returns or does not resolve, may repeat after 4 hours; do not exceed five (5) mg in 24 hours. 10 tablet PRN  . pantoprazole (PROTONIX) 40 MG tablet Take 1 tablet (40 mg total) by mouth 2 (two) times daily. 60 tablet 1  .  pravastatin (PRAVACHOL) 40 MG tablet Take 1 tablet (40 mg total) by mouth daily. 90 tablet 3  . sucralfate (CARAFATE) 1 g tablet Take 1 tablet (1 g total) by mouth 4 (four) times daily -  with meals and at bedtime. 120 tablet 0   No facility-administered medications prior to visit.    Allergies  Allergen Reactions  . Fish Allergy Hives  . Shellfish Allergy Hives    Review of Systems     Objective:    Physical Exam Vitals reviewed.  Constitutional:      Appearance: She is well-developed.  HENT:     Head: Normocephalic and atraumatic.  Eyes:     Conjunctiva/sclera: Conjunctivae normal.  Cardiovascular:     Rate and Rhythm: Normal rate.  Pulmonary:     Effort: Pulmonary effort is normal.  Musculoskeletal:     Comments: She is tender over the distal first and second metatarsal heads.  She does have some flexion extension of the great toe but it is limited.  Old surgical scar appears to be well-healed.  She has significant swelling between the first and second metatarsals on the top of the foot.  Nontender on the bottom of the foot no palpable nodules etc.  Dorsal pedal pulses 1+.  Skin:    General: Skin is dry.     Coloration: Skin is not pale.  Neurological:     Mental Status: She is alert and oriented to person, place, and time.  Psychiatric:        Behavior: Behavior normal.     BP 140/74   Pulse 89   Ht 5\' 6"  (1.676 m)   Wt 204 lb (92.5 kg)   SpO2 100%   BMI 32.93 kg/m  Wt Readings from Last 3 Encounters:  08/23/19 204 lb (92.5 kg)  08/04/19 205 lb (93 kg)  03/31/19 210 lb (95.3 kg)    Health Maintenance Due  Topic Date Due  . MAMMOGRAM  03/29/2018    There are no preventive care reminders to display for this patient.   Lab Results  Component Value Date   TSH 0.28 (L) 03/19/2016   Lab Results  Component Value Date   WBC 7.1 08/04/2019   HGB 14.7 08/04/2019   HCT 43.7 08/04/2019   MCV 84.7 08/04/2019  PLT 225 08/04/2019   Lab Results   Component Value Date   NA 147 (H) 08/04/2019   K 3.9 08/04/2019   CO2 32 08/04/2019   GLUCOSE 98 08/04/2019   BUN 10 08/04/2019   CREATININE 0.82 08/04/2019   BILITOT 0.4 08/04/2019   ALKPHOS 119 04/14/2018   AST 21 08/04/2019   ALT 19 08/04/2019   PROT 6.9 08/04/2019   ALBUMIN 4.0 03/19/2016   CALCIUM 9.7 08/04/2019   Lab Results  Component Value Date   CHOL 260 (H) 03/19/2016   Lab Results  Component Value Date   HDL 41 (L) 03/19/2016   Lab Results  Component Value Date   LDLCALC NOT CALC 03/19/2016   Lab Results  Component Value Date   TRIG 409 (H) 03/19/2016   Lab Results  Component Value Date   CHOLHDL 6.3 (H) 03/19/2016   No results found for: HGBA1C     Assessment & Plan:   Problem List Items Addressed This Visit    None    Visit Diagnoses    Left foot pain    -  Primary   Relevant Orders   DG Foot Complete Left     Left distal foot pain primarily over the first and second distal metatarsal heads.  We will get x-rays today just to rule out any type of stress fracture injury etc. though she does not remember a specific injury and we will go ahead and place her in a boot which she finds much more comfortable.  She did have significant swelling and still has some swelling present but no erythema or increased heat to suggest other condition such as gout etc. Fit for tall CAM walker boot.     No orders of the defined types were placed in this encounter.    Nani Gasser, MD

## 2019-08-25 ENCOUNTER — Ambulatory Visit: Payer: Self-pay | Admitting: Family Medicine

## 2019-08-25 VITALS — BP 140/74 | HR 89 | Ht 66.0 in | Wt 204.0 lb

## 2019-08-25 DIAGNOSIS — Z23 Encounter for immunization: Secondary | ICD-10-CM

## 2019-08-25 NOTE — Progress Notes (Signed)
Agree with documentation as above.   Brodan Grewell, MD  

## 2019-08-25 NOTE — Progress Notes (Signed)
Pt here for Hep A/B series. Pt tolerated immunization well. She will need to f/u in 1 month for her next injection.Laureen Ochs, Viann Shove, CMA

## 2019-09-01 ENCOUNTER — Ambulatory Visit (INDEPENDENT_AMBULATORY_CARE_PROVIDER_SITE_OTHER): Payer: Medicaid Other | Admitting: Physician Assistant

## 2019-09-01 DIAGNOSIS — Z5329 Procedure and treatment not carried out because of patient's decision for other reasons: Secondary | ICD-10-CM

## 2019-09-01 NOTE — Progress Notes (Signed)
No show

## 2019-09-27 ENCOUNTER — Ambulatory Visit: Payer: Self-pay | Admitting: Family Medicine

## 2019-09-27 ENCOUNTER — Other Ambulatory Visit: Payer: Self-pay

## 2019-09-27 ENCOUNTER — Encounter: Payer: Self-pay | Admitting: Family Medicine

## 2019-09-27 VITALS — BP 159/89 | HR 90 | Ht 66.0 in | Wt 204.0 lb

## 2019-09-27 DIAGNOSIS — Z23 Encounter for immunization: Secondary | ICD-10-CM

## 2019-09-27 NOTE — Progress Notes (Signed)
Agree with documentation as above.   Charleene Callegari, MD  

## 2019-09-27 NOTE — Progress Notes (Signed)
Patient is here for a hepatitis A/B vaccination. Denies any issues with last injection. Hepatitis A/B injection to left deltoid with no apparent complications. Patient advised to schedule next injection in 6 months.

## 2019-12-09 ENCOUNTER — Telehealth: Payer: Self-pay

## 2019-12-09 ENCOUNTER — Encounter: Payer: Self-pay | Admitting: Nurse Practitioner

## 2019-12-09 ENCOUNTER — Telehealth (INDEPENDENT_AMBULATORY_CARE_PROVIDER_SITE_OTHER): Payer: Self-pay | Admitting: Nurse Practitioner

## 2019-12-09 DIAGNOSIS — K529 Noninfective gastroenteritis and colitis, unspecified: Secondary | ICD-10-CM

## 2019-12-09 MED ORDER — LOPERAMIDE HCL 2 MG PO TABS: ORAL_TABLET | ORAL | 0 refills | Status: DC

## 2019-12-09 MED ORDER — ONDANSETRON HCL 8 MG PO TABS: 8.0000 mg | ORAL_TABLET | Freq: Three times a day (TID) | ORAL | 2 refills | Status: DC | PRN

## 2019-12-09 MED ORDER — PROMETHAZINE HCL 25 MG PO TABS: 25.0000 mg | ORAL_TABLET | Freq: Four times a day (QID) | ORAL | 2 refills | Status: DC | PRN

## 2019-12-09 NOTE — Progress Notes (Signed)
Virtual Visit via [place] connected with  Wanda Ortiz on 12/09/19 at  1:30 PM EDT by the video enabled telemedicine application, MyChart, and verified that I am speaking with the correct person using two identifiers.   I introduced myself as a Designer, jewellery with the practice. We discussed the limitations of evaluation and management by telemedicine and the availability of in person appointments. The patient expressed understanding and agreed to proceed.  The patient is: at home I am: in the office  Subjective:    CC: vomiting and diarrhea  HPI: Wanda Ortiz is a 59 y.o. y/o female presenting via [place] today for sudden onset of vomiting and diarrhea that started about 4 o'clock this morning. She reports she was getting ready for work and experienced diarrhea four times in about an hour and vomited once. She felt better and went on to work, where she began to feel ill again and had diarrhea again with nausea. She left work and went home and has been experiencing abdominal pain, cramping, and nausea since that time. She has not tried any treatments. She does not know of anything that makes her symptoms worse or better. She does not know of anyone else in her family with similar symptoms.   She denies fever, chills, bloody diarrhea, headache, body aches, dizziness, chest pain, or known sick contacts.  She does work in an assisted living facility.   Past medical history, Surgical history, Family history not pertinant except as noted below, Social history, Allergies, and medications have been entered into the medical record, reviewed, and corrections made.   Review of Systems:  See HPI  Objective:    General: Speaking clearly in complete sentences without any shortness of breath.  Alert and oriented x3.  Normal judgment. No apparent acute distress.   Impression and Recommendations:    1. Gastroenteritis Symptoms and presentation consistent with  viral gastroenteritis. Discussed symptom management and hydration with the patient and medication options to help with symptoms.   PLAN: -Stay well hydrated- drink small amounts of low or no sugar liquids -Advance diet as tolerated starting with Bananas, Rice, Applesauce, and Toast -Take zofran for nausea and vomiting during the day -Take promethazine for active vomiting not well controlled with Zofran- do not use while driving -Take Imodium as directed for diarrhea -If you begin to experience fever, chills, shortness of breath, inability to keep liquids down, uncontrolled diarrhea or vomiting, dizziness, or weakness seek emergency care.  -Follow-up if your symptoms worsen or fail to improve.   - ondansetron (ZOFRAN) 8 MG tablet; Take 1 tablet (8 mg total) by mouth every 8 (eight) hours as needed for nausea or vomiting.  Dispense: 30 tablet; Refill: 2 - promethazine (PHENERGAN) 25 MG tablet; Take 1 tablet (25 mg total) by mouth every 6 (six) hours as needed. Take while at home when you can rest.  Dispense: 15 tablet; Refill: 2 - loperamide (IMODIUM A-D) 2 MG tablet; Take 2 tabs (4mg ) after first loose stool. Then 1 tab (2mg ) after additional loose stools for a maximum of 16mg  (8 tabs) in a 24 hour period.  Dispense: 30 tablet; Refill: 0    I discussed the assessment and treatment plan with the patient. The patient was provided an opportunity to ask questions and all were answered. The patient agreed with the plan and demonstrated an understanding of the instructions.   The patient was advised to call back or seek an in-person evaluation if the symptoms  worsen or if the condition fails to improve as anticipated.  I provided 20 minutes of non-face-to-face interaction with this MYCHART visit.   Tollie Eth, NP

## 2019-12-09 NOTE — Telephone Encounter (Signed)
Pt called back to provide fax number for her employer to send the work note to.   [employer] [phone] Fax

## 2020-03-29 ENCOUNTER — Ambulatory Visit: Payer: Medicaid Other

## 2020-06-08 ENCOUNTER — Encounter: Payer: Medicaid Other | Admitting: Family Medicine

## 2020-06-08 ENCOUNTER — Telehealth: Payer: Self-pay | Admitting: Family Medicine

## 2020-06-08 NOTE — Telephone Encounter (Signed)
Pt called at 11:57 to cancel her appt. She is not feeling well.  Thank you

## 2020-07-04 ENCOUNTER — Telehealth (INDEPENDENT_AMBULATORY_CARE_PROVIDER_SITE_OTHER): Payer: Medicaid Other | Admitting: Physician Assistant

## 2020-07-04 DIAGNOSIS — Z5329 Procedure and treatment not carried out because of patient's decision for other reasons: Secondary | ICD-10-CM

## 2020-07-04 DIAGNOSIS — Z91199 Patient's noncompliance with other medical treatment and regimen due to unspecified reason: Secondary | ICD-10-CM

## 2020-07-04 NOTE — Progress Notes (Signed)
Patient ID: Wanda Ortiz, female   DOB: 1960-09-04, 60 y.o.   MRN: [id number] .Marland KitchenVirtual Visit via Telephone Note  Not able to connect.  Called 3 times. People answered and then hung up. Willing to connect later in the day or over lunch if calls back and having phone issues.   Tandy Gaw, PA-C

## 2020-07-06 ENCOUNTER — Other Ambulatory Visit: Payer: Self-pay

## 2020-07-06 ENCOUNTER — Telehealth (INDEPENDENT_AMBULATORY_CARE_PROVIDER_SITE_OTHER): Payer: Medicaid Other | Admitting: Nurse Practitioner

## 2020-07-06 ENCOUNTER — Encounter: Payer: Self-pay | Admitting: Nurse Practitioner

## 2020-07-06 ENCOUNTER — Other Ambulatory Visit: Payer: Self-pay | Admitting: Nurse Practitioner

## 2020-07-06 DIAGNOSIS — U071 COVID-19: Secondary | ICD-10-CM

## 2020-07-06 NOTE — Progress Notes (Signed)
Virtual Video Visit via MyChart Note  I connected with  Wanda Ortiz on 07/06/20 at 10:50 AM EST by the video enabled telemedicine application for , MyChart, and verified that I am speaking with the correct person using two identifiers.   I introduced myself as a Publishing rights manager with the practice. We discussed the limitations of evaluation and management by telemedicine and the availability of in person appointments. The patient expressed understanding and agreed to proceed.  Participating parties in this visit include: The patient and the nurse practitioner listed.  The patient is: At home I am: In the office  Subjective:    CC:  Chief Complaint  Patient presents with  . Covid Positive    Onset 3 days ago, tested positive for COVID this morning at work, BA, fatigue    HPI: Wanda Ortiz is a 60 y.o. year old female presenting today via MyChart today for COVID positive test at work today.  Wanda Ortiz reports that she began to experience symptoms on Monday with sudden onset of chills, fever, and body aches. She felt like she had the flu. She reports the fever subsided on Tuesday and she went into work today and was tested with a rapid test, which returned positive for COVID. Her work requires that she have PCR testing to confirm. 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   She endorses fatigue and body aches at this time as her only symptoms.  She denies shortness of breath, chest pain, difficulty breathing, loss of taste, or loss of smell.   Past medical history, Surgical history, Family history not pertinant except as noted below, Social history, Allergies, and medications have been entered into the medical record, reviewed, and corrections made.   Review of Systems:  All review of systems negative except what is listed in the HPI   Objective:    General:  Speaking clearly in complete sentences. Absent shortness of breath noted.   Alert and  oriented x3.   Normal judgment.  Absent acute distress.   Impression and Recommendations:    1. COVID-19 Symptoms and presentation of mild illness with positive COVID rapid test. She is in the parking lot of the office and I will go out and swab her at this time for the PCR test as a follow-up testing method as required by her employer. Discussed quarantine requirements and symptom management that may be helpful.  Discussed warning signs that would warrant immediate evaluation in the emergency room.  Will notify patient by telephone of results as they are available.    Follow-up if symptoms worsen or fail to improve.    I discussed the assessment and treatment plan with the patient. The patient was provided an opportunity to ask questions and all were answered. The patient agreed with the plan and demonstrated an understanding of the instructions.   The patient was advised to call back or seek an in-person evaluation if the symptoms worsen or if the condition fails to improve as anticipated.  I provided 20 minutes of non-face-to-face interaction with this MYCHART visit including intake, same-day documentation, and chart review.   Tollie Eth, NP

## 2020-07-06 NOTE — Patient Instructions (Signed)
Over the counter medications that may be helpful for symptoms: . Guaifenesin 1200 mg extended release tabs twice daily, with plenty of water o For cough and congestion o Brand name: Mucinex   . Pseudoephedrine 30 mg, one or two tabs every 4 to 6 hours o For sinus congestion o Brand name: Sudafed o You must get this from the pharmacy counter.  . Oxymetazoline nasal spray each morning, one spray in each nostril, for NO MORE THAN 3 days  o For nasal and sinus congestion o Brand name: Afrin . Saline nasal spray or Saline Nasal Irrigation 3-5 times a day o For nasal and sinus congestion o Brand names: Ocean or AYR . Fluticasone nasal spray, one spray in each nostril, each morning after oxymetazoline and saline, if used o For nasal and sinus congestion o Brand name: Flonase . Warm salt water gargles  o For sore throat o Every few hours as needed . Alternate ibuprofen 400-600 mg and acetaminophen 1000 mg every 4-6 hours o For fever, body aches, headache o Brand names: Motrin or Advil and Tylenol . Dextromethorphan 12-hour cough version 30 mg every 12 hours  o For cough o Brand name: Delsym Stop all other cold medications for now (Nyquil, Dayquil, Tylenol Cold, Theraflu, etc) and other non-prescription cough/cold preparations. Many of these have the same ingredients listed above and could cause an overdose of medication.   General Instructions . Allow your body to rest . Drink PLENTY of fluids . Isolate yourself from everyone, even family, until test results have returned  If your COVID-19 test is positive . Then you ARE INFECTED and you can pass the virus to others . You must quarantine from others for a minimum of  o 10 days since symptoms started AND o You are fever free for 24 hours WITHOUT any medication to reduce fever AND o Your symptoms are improving . Do not go to the store or other public areas . Do not go around household members who are not known to be infected with  COVID-19 . If you MUST leave you area of quarantine (example: go to a bathroom you share with others in your home), you must o Wear a mask o Wash your hands thoroughly o Wipe down any surfaces you touch . Do not share food, drinks, towels, or other items with other persons . Dispose of your own tissues, food containers, etc  Once you have recovered, please continue good preventive care measures, including:  . wearing a mask when in public . wash your hands frequently . avoid touching your face/nose/eyes . cover coughs/sneezes with the inside of your elbow . stay out of crowds . keep a 6 foot distance from others  Go to the nearest hospital emergency room if fever/cough/breathlessness are severe or illness seems like a threat to life.   

## 2020-07-07 ENCOUNTER — Ambulatory Visit
Admission: EM | Admit: 2020-07-07 | Discharge: 2020-07-07 | Disposition: A | Discharge status: Home / Self Care | Payer: Medicaid Other | Attending: Family Medicine | Admitting: Family Medicine

## 2020-07-07 ENCOUNTER — Other Ambulatory Visit: Payer: Self-pay

## 2020-07-07 DIAGNOSIS — B349 Viral infection, unspecified: Secondary | ICD-10-CM

## 2020-07-07 DIAGNOSIS — Z1152 Encounter for screening for COVID-19: Secondary | ICD-10-CM

## 2020-07-07 DIAGNOSIS — Z20822 Contact with and (suspected) exposure to covid-19: Secondary | ICD-10-CM

## 2020-07-07 NOTE — ED Triage Notes (Signed)
Patient presents to Urgent Care with complaints of body aches, chills, cough x 4 days.   Denies fever, n/v, or diarrhea.

## 2020-07-07 NOTE — ED Provider Notes (Signed)
Wanda Ortiz    CSN: [id number] Arrival date & time: 07/07/20  0859      History   Chief Complaint Chief Complaint  Patient presents with  . Generalized Body Aches  . Chills    HPI Wanda Ortiz is a 60 y.o. female.   60 year old female presents today for generalized body aches, chills, mild cough.  This is been present for the past 4 days.  Positive Covid exposure.  Denies any fever, chills, nausea, vomiting or diarrhea.     Past Medical History:  Diagnosis Date  . AR (allergic rhinitis)   . Hyperlipemia   . Hypertension   . Migraines     Patient Active Problem List   Diagnosis Date Noted  . Hepatic steatosis 08/06/2019  . Nuclear sclerotic cataract of both eyes 10/28/2018  . 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 Retinal edema 10/28/2018  . Hallux rigidus of left foot 06/26/2017  . Macromastia 09/05/2016  . Left elbow pain 07/12/2016  . Left lumbar radiculitis 07/12/2016  . Essential hypertension 02/26/2016  . Hyperlipidemia 03/24/2015  . Rotator cuff syndrome of left shoulder 04/23/2013  . Disorder of bursae and tendons in shoulder region 04/23/2013  . CARPAL TUNNEL SYNDROME 05/03/2009  . POSTMENOPAUSAL STATUS 01/20/2008  . OVARIAN MASS 12/31/2006  . Migraine headache 04/08/2006    Past Surgical History:  Procedure Laterality Date  . ABDOMINAL HYSTERECTOMY     partial with BSO for [facility] Tumor    OB History   No obstetric history on file.      Home Medications    Prior to Admission medications   Medication Sig Start Date End Date Taking? Authorizing Provider  cetirizine-pseudoephedrine (ZYRTEC-D) 5-120 MG per tablet Take 1 tablet by mouth daily as needed.    [provider]  diclofenac (VOLTAREN) 75 MG EC tablet Take by mouth. 09/24/19   [provider]  diclofenac sodium (VOLTAREN) 1 % GEL 2 gm to knee as needed for pain 01/25/19   [provider]  folic acid (FOLVITE) 1  MG tablet Take by mouth daily.     Eber Jones, MD  hydrochlorothiazide (HYDRODIURIL) 25 MG tablet Take 1 tablet (25 mg total) by mouth daily. 08/04/19   Agapito Games, MD  ibuprofen (ADVIL) 800 MG tablet Take 1 tablet by mouth every 8 (eight) hours as needed. 03/28/19   [provider]  loperamide (IMODIUM A-D) 2 MG tablet Take 2 tabs (4mg ) after first loose stool. Then 1 tab (2mg ) after additional loose stools for a maximum of 16mg  (8 tabs) in a 24 hour period. 12/09/19   , NP  methotrexate (RHEUMATREX) 2.5 MG tablet Take 4 tablets by mouth once a week. 11/16/18   [provider]  naratriptan (AMERGE) 2.5 MG tablet Take 1 tablet (2.5 mg total) by mouth as needed. Take one (1) tablet at onset of headache; if returns or does not resolve, may repeat after 4 hours; do not exceed five (5) mg in 24 hours. 11/20/18   Tollie Eth, MD  ondansetron (ZOFRAN) 8 MG tablet Take 1 tablet (8 mg total) by mouth every 8 (eight) hours as needed for nausea or vomiting. 12/09/19   Early, 11/22/18, NP  pantoprazole (PROTONIX) 40 MG tablet Take 1 tablet (40 mg total) by mouth 2 (two) times daily. 08/06/19   02/08/20, MD  pravastatin (PRAVACHOL) 40 MG tablet Take 1 tablet (40 mg total) by mouth daily. 08/09/19  Hali Marry, MD  promethazine (PHENERGAN) 25 MG tablet Take 1 tablet (25 mg total) by mouth every 6 (six) hours as needed. Take while at home when you can rest. 12/09/19   Early, Coralee Pesa, NP  sucralfate (CARAFATE) 1 g tablet Take 1 tablet (1 g total) by mouth 4 (four) times daily -  with meals and at bedtime. 08/04/19   Hali Marry, MD    Family History Family History  Problem Relation Age of Onset  . Diabetes Mother   . Hyperlipidemia Mother   . Hypertension Father   . Cancer Father        prostate    Social History Social History   Tobacco Use  . Smoking status: Former Smoker    Packs/day: 0.30    Years: 15.00    Pack years: 4.50     Types: Cigarettes    Quit date: 11/30/2018    Years since quitting: 1.6  . Smokeless tobacco: Never Used  Substance Use Topics  . Alcohol use: Yes    Alcohol/week: 2.0 standard drinks    Types: 2 Standard drinks or equivalent per week  . Drug use: No     Allergies   Fish allergy and Shellfish allergy   Review of Systems Review of Systems   Physical Exam Triage Vital Signs ED Triage Vitals  Enc Vitals Group     BP 07/07/20 0913 (!) 146/86     Pulse Rate 07/07/20 0913 82     Resp 07/07/20 0913 16     Temp 07/07/20 0913 97.6 F (36.4 C)     Temp Source 07/07/20 0913 Oral     SpO2 07/07/20 0913 96 %     Weight --      Height --      Head Circumference --      Peak Flow --      Pain Score 07/07/20 0917 0     Pain Loc --      Pain Edu? --      Excl. in [place]? --    No data found.  Updated Vital Signs BP (!) 146/86 (BP Location: Left Arm)   Pulse 82   Temp 97.6 F (36.4 C) (Oral)   Resp 16   SpO2 96%   Visual Acuity Right Eye Distance:   Left Eye Distance:   Bilateral Distance:    Right Eye Near:   Left Eye Near:    Bilateral Near:     Physical Exam Vitals and nursing note reviewed.  Constitutional:      General: She is not in acute distress.    Appearance: Normal appearance. She is not ill-appearing, toxic-appearing or diaphoretic.  HENT:     Head: Normocephalic.     Nose: Nose normal.  Eyes:     Conjunctiva/sclera: Conjunctivae normal.  Pulmonary:     Effort: Pulmonary effort is normal.  Musculoskeletal:        General: Normal range of motion.     Cervical back: Normal range of motion.  Skin:    General: Skin is warm and dry.     Findings: No rash.  Neurological:     Mental Status: She is alert.  Psychiatric:        Mood and Affect: Mood normal.      UC Treatments / Results  Labs (all labs ordered are listed, but only abnormal results are displayed) Labs Reviewed  NOVEL CORONAVIRUS, NAA    EKG   Radiology No results  found.  Procedures Procedures (including critical care time)  Medications Ordered in UC Medications - No data to display  Initial Impression / Assessment and Plan / UC Course  I have reviewed the triage vital signs and the nursing notes.  Pertinent labs & imaging results that were available during my care of the patient were reviewed by me and considered in my medical decision making (see chart for details).     Covid exposure Covid test pending Recommend over-the-counter medicines as needed.  Quarantine precautions given. Follow up as needed for continued or worsening symptoms  Final Clinical Impressions(s) / UC Diagnoses   Final diagnoses:  Viral illness  Close exposure to COVID-19 virus     Discharge Instructions     Covid test pending.  You can take OTC medicines as needed  Follow up as needed for continued or worsening symptoms     ED Prescriptions    None     PDMP not reviewed this encounter.   Dahlia Byes A, NP 07/07/20 0930

## 2020-07-07 NOTE — Discharge Instructions (Signed)
Covid test pending.  °You can take OTC medicines as needed  °Follow up as needed for continued or worsening symptoms °

## 2020-07-10 LAB — SARS-COV-2, NAA 2 DAY TAT

## 2020-07-10 LAB — NOVEL CORONAVIRUS, NAA
SARS-CoV-2, NAA: DETECTED — AB
SARS-CoV-2, NAA: DETECTED — AB

## 2020-07-10 NOTE — Progress Notes (Signed)
Please call patient.   COVID results positive. Please continue to quarantine for full 10 day period if any symptoms are present and until fever has been gone for 24 hours and your symptoms are improving.   OK to write work note.

## 2020-07-16 ENCOUNTER — Ambulatory Visit: Payer: Self-pay

## 2020-09-18 ENCOUNTER — Telehealth (INDEPENDENT_AMBULATORY_CARE_PROVIDER_SITE_OTHER): Payer: PRIVATE HEALTH INSURANCE | Admitting: Family Medicine

## 2020-09-18 ENCOUNTER — Encounter: Payer: Self-pay | Admitting: Family Medicine

## 2020-09-18 VITALS — Wt 188.0 lb

## 2020-09-18 DIAGNOSIS — Z566 Other physical and mental strain related to work: Secondary | ICD-10-CM

## 2020-09-18 DIAGNOSIS — M25511 Pain in right shoulder: Secondary | ICD-10-CM

## 2020-09-18 NOTE — Progress Notes (Signed)
Called pt to do her prescreening. LVM   Pt reports that for the past 2 days her R shoulder is painful, and she is unsure as to what may have happened.   She does a lot of lifting at work. She has been taking tylenol pm for the pain. She rates the pain 7-8/10 depending on how she moves it. The pain is achy and dull and it throbs.

## 2020-09-18 NOTE — Progress Notes (Signed)
Virtual Visit via Video Note  I connected with [name] on 09/20/20 at  1:20 PM EDT by a video enabled telemedicine application and verified that I am speaking with the correct person using two identifiers.   I discussed the limitations of evaluation and management by telemedicine and the availability of in person appointments. The patient expressed understanding and agreed to proceed.  Patient location: at home  Provider location: in office  Subjective:    CC: Left shoulder pain.   HPI: C/O of right shoulder pain x 6 days.  Has been working overtime.  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  Now has been takingTylenol PM at night. Occ Aleve in AM.  No injury or trauma but has been doing more lifting.  Hx of rotator cuff syndrome of the left shoulder. Says hard time lifting the shoulder past about 90 degrees.     Past medical history, Surgical history, Family history not pertinant except as noted below, Social history, Allergies, and medications have been entered into the medical record, reviewed, and corrections made.   Review of Systems: No fevers, chills, night sweats, weight loss, chest pain, or shortness of breath.   Objective:    General: Speaking clearly in complete sentences without any shortness of breath.  Alert and oriented x3.  Normal judgment. No apparent acute distress.    Impression and Recommendations:    No problem-specific Assessment & Plan notes found for this encounter.  Right shoulder pain -I like to start with a plain film x-ray for further evaluation.  Leaving Tylenol do seem to help so she can continue that for pain control she definitely has decreased range of motion which would indicate possibly rotator cuff tear or injury or possibly a frozen shoulder.  I had like to get her in with our sports medicine doc for further evaluation.  Situational stress-she is also just feeling really overwhelmed with everything going on at work.  She just  feels like mentally she is having a hard time dealing with it.  Hopefully they will get some new people hired which will make a really big difference but in the meantime she would like a medication that might help calm her down.  She would like to stick with something really mild if possible.  So we will treat with Wellbutrin.  Call if any problems or side effects.  It may also help her stay focused.     Time spent in encounter 22 minutes  I discussed the assessment and treatment plan with the patient. The patient was provided an opportunity to ask questions and all were answered. The patient agreed with the plan and demonstrated an understanding of the instructions.   The patient was advised to call back or seek an in-person evaluation if the symptoms worsen or if the condition fails to improve as anticipated.   Nani Gasser, MD

## 2020-09-19 MED ORDER — BUPROPION HCL ER (XL) 150 MG PO TB24: 150.0000 mg | ORAL_TABLET | Freq: Every day | ORAL | 1 refills | Status: AC

## 2020-09-25 ENCOUNTER — Ambulatory Visit (INDEPENDENT_AMBULATORY_CARE_PROVIDER_SITE_OTHER): Payer: PRIVATE HEALTH INSURANCE

## 2020-09-25 ENCOUNTER — Ambulatory Visit (INDEPENDENT_AMBULATORY_CARE_PROVIDER_SITE_OTHER): Payer: PRIVATE HEALTH INSURANCE | Admitting: Sports Medicine

## 2020-09-25 ENCOUNTER — Other Ambulatory Visit: Payer: Self-pay

## 2020-09-25 DIAGNOSIS — M7551 Bursitis of right shoulder: Secondary | ICD-10-CM

## 2020-09-25 DIAGNOSIS — M25511 Pain in right shoulder: Secondary | ICD-10-CM | POA: Diagnosis not present

## 2020-09-25 NOTE — Assessment & Plan Note (Signed)
Acute right shoulder bursitis, she has tried oral analgesics without much improvement. Positive impingement signs on exam without rotator cuff weakness, good motion so I do not think there is frozen shoulder. Subacromial injection as above, conditioning exercises given, return to see me in 1 month.

## 2020-09-25 NOTE — Progress Notes (Signed)
    Procedures performed today:    Procedure: Real-time Ultrasound Guided injection of the right subacromial bursa Device: Samsung HS60  Verbal informed consent obtained.  Time-out conducted.  Noted no overlying erythema, induration, or other signs of local infection.  Skin prepped in a sterile fashion.  Local anesthesia: Topical Ethyl chloride.  With sterile technique and under real time ultrasound guidance:  Intact rotator cuff noted, 1 cc Kenalog 40, 1 cc lidocaine, 1 cc bupivacaine injected easily Completed without difficulty  Advised to call if fevers/chills, erythema, induration, drainage, or persistent bleeding.  Images permanently stored and available for review in PACS.  Impression: Technically successful ultrasound guided injection.  Independent interpretation of notes and tests performed by another provider:   X-rays personally reviewed, extremely mild glenohumeral DJD but otherwise nothing else noted.  Brief History, Exam, Impression, and Recommendations:    Acute shoulder bursitis, right Acute right shoulder bursitis, she has tried oral analgesics without much improvement. Positive impingement signs on exam without rotator cuff weakness, good motion so I do not think there is frozen shoulder. Subacromial injection as above, conditioning exercises given, return to see me in 1 month.    ___________________________________________ Ihor Austin. Benjamin Stain, M.D., ABFM., CAQSM. Primary Care and Sports Medicine [facility]  Adjunct Instructor of Family Medicine  [employer]

## 2020-09-27 ENCOUNTER — Other Ambulatory Visit: Payer: Self-pay | Admitting: *Deleted

## 2020-09-27 DIAGNOSIS — M7551 Bursitis of right shoulder: Secondary | ICD-10-CM

## 2020-10-02 ENCOUNTER — Telehealth (INDEPENDENT_AMBULATORY_CARE_PROVIDER_SITE_OTHER): Payer: PRIVATE HEALTH INSURANCE | Admitting: Family Medicine

## 2020-10-02 ENCOUNTER — Encounter: Payer: Self-pay | Admitting: Family Medicine

## 2020-10-02 VITALS — Ht 66.0 in | Wt 188.0 lb

## 2020-10-02 DIAGNOSIS — J209 Acute bronchitis, unspecified: Secondary | ICD-10-CM | POA: Diagnosis not present

## 2020-10-02 MED ORDER — ALBUTEROL SULFATE HFA 108 (90 BASE) MCG/ACT IN AERS: 2.0000 | INHALATION_SPRAY | Freq: Four times a day (QID) | RESPIRATORY_TRACT | 0 refills | Status: DC | PRN

## 2020-10-02 MED ORDER — HYDROCOD POLST-CPM POLST ER 10-8 MG/5ML PO SUER: 5.0000 mL | Freq: Two times a day (BID) | ORAL | 0 refills | Status: DC | PRN

## 2020-10-02 NOTE — Progress Notes (Signed)
Cough began on Friday. Pt reports she has a lot of drainage when she coughs and its a deep cough. She stated that the mucus is yellow.  Denies and f/s/c/n/v/d/headaches or bodyaches She stated that she was around her grandson that has bronchitis this was last week.

## 2020-10-02 NOTE — Progress Notes (Signed)
Virtual Visit via Video Note  I connected with Wanda Ortiz on 10/02/20 at 10:10 AM EDT by a video enabled telemedicine application and verified that I am speaking with the correct person using two identifiers.   I discussed the limitations of evaluation and management by telemedicine and the availability of in person appointments. The patient expressed understanding and agreed to proceed.  Patient location: at home  Provider location: in office  Subjective:    CC: Cough   HPI: Cough began on Friday. Pt reports she has a lot of drainage when she coughs and its a deep cough. She stated that the mucus is yellow. Negative for COVID.  + post nasal drip.  Cough is keeping her awake at night.  Denies and f/s/c/n/v/d/headaches or bodyaches. Chest feels tight, some wheezing.  She stated that she was around her grandson that has bronchitis this was last week. Some SOB.  No GI sxs.  Taking Mucinex.     Past medical history, Surgical history, Family history not pertinant except as noted below, Social history, Allergies, and medications have been entered into the medical record, reviewed, and corrections made.   Review of Systems: No fevers, chills, night sweats, weight loss, chest pain, or shortness of breath.   Objective:    General: Speaking clearly in complete sentences without any shortness of breath.  Alert and oriented x3.  Normal judgment. No apparent acute distress.    Impression and Recommendations:    No problem-specific Assessment & Plan notes found for this encounter.  Acute bronchitis - Discussed likely viral.  Work on symptomatic care.  Prescription sent for inhaler because of the chest tightness shortness of breath and wheezing.  If she feels like she is getting worse then consider further work-up with chest x-ray she has been afebrile which is good.  Did give her a work note for the next 2 days.  Also discussed that we could add steroids if needed but at this point no  indication for that.  Also prescription sent for cough syrup for bedtime to help her get some rest.   Time spent in encounter 21 minutes  I discussed the assessment and treatment plan with the patient. The patient was provided an opportunity to ask questions and all were answered. The patient agreed with the plan and demonstrated an understanding of the instructions.   The patient was advised to call back or seek an in-person evaluation if the symptoms worsen or if the condition fails to improve as anticipated.   Nani Gasser, MD

## 2020-10-09 ENCOUNTER — Other Ambulatory Visit: Payer: Self-pay

## 2020-10-09 ENCOUNTER — Ambulatory Visit (INDEPENDENT_AMBULATORY_CARE_PROVIDER_SITE_OTHER): Payer: PRIVATE HEALTH INSURANCE | Admitting: Rehabilitative and Restorative Service Providers"

## 2020-10-09 DIAGNOSIS — M25511 Pain in right shoulder: Secondary | ICD-10-CM | POA: Diagnosis not present

## 2020-10-09 DIAGNOSIS — R293 Abnormal posture: Secondary | ICD-10-CM

## 2020-10-09 NOTE — Therapy (Addendum)
[facility]  [address] [place], [place], [address] Phone: [phone]   Fax:  [phone]  Physical Therapy Evaluation and Discharge Summary  Patient Details  Name: Wanda Ortiz MRN: [id number] Date of Birth: 04/20/1961 Referring Provider (PT): Nani Gasser, MD    Patient did not return to PT due to high copay with physical therapy.  Please refer to initial evaluation for patient status. Thank you for the referral of this patient. Margretta Ditty, MPT   Encounter Date: 10/09/2020   PT End of Session - 10/09/20 0758    Visit Number 1    Number of Visits 6    Date for PT Re-Evaluation 11/20/20    PT Start Time 0717    PT Stop Time 0750    PT Time Calculation (min) 33 min    Activity Tolerance Patient tolerated treatment well    Behavior During Therapy [facility] for tasks assessed/performed           Past Medical History:  Diagnosis Date  . AR (allergic rhinitis)   . Hyperlipemia   . Hypertension   . Migraines     Past Surgical History:  Procedure Laterality Date  . ABDOMINAL HYSTERECTOMY     partial with BSO for [facility]    There were no vitals filed for this visit.    Subjective Assessment - 10/09/20 0717    Subjective The patient reports a couple week history of acute shoulder pain on the right side.  She got exercises from Dr. Benjamin Stain and feels some improvement since then.  She has difficulty with lifting and moving overhead.    Patient Stated Goals reduce pain    Currently in Pain? Yes    Pain Score 1    goes up to 8/10 after working   Pain Location Shoulder    Pain Orientation Right    Pain Descriptors / Indicators Sore    Pain Type Acute pain    Pain Onset More than a month ago    Pain Frequency Intermittent    Aggravating Factors  lifting at work    Pain Relieving Factors exercises, ice              [facility] PT Assessment - 10/09/20 0723      Assessment   Medical  Diagnosis acute R shoulder bursitis    Referring Provider (PT) Nani Gasser, MD    Onset Date/Surgical Date 09/27/20    Hand Dominance Right    Prior Therapy none      Precautions   Precautions None      Restrictions   Weight Bearing Restrictions No      Balance Screen   Has the patient fallen in the past 6 months No    Has the patient had a decrease in activity level because of a fear of falling?  No    Is the patient reluctant to leave their home because of a fear of falling?  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      Observation/Other [employer] on Therapeutic Outcomes (FOTO)  66%      Sensation   Light Touch Appears Intact      Posture/Postural Control   Posture/Postural Control Postural limitations  Postural Limitations Rounded Shoulders;Forward head      ROM / Strength   AROM / PROM / Strength AROM;Strength      AROM   Overall AROM  Within functional limits for tasks performed    Overall AROM Comments equal bilaterally    AROM Assessment Site Shoulder    Right/Left Shoulder Right;Left    Right Shoulder Flexion 150 Degrees    Right Shoulder Internal Rotation --   low t-spine with pulling anterior R shoulder   Right Shoulder External Rotation --   equal to left side-- reaches behind head   Left Shoulder Flexion 150 Degrees    Left Shoulder ABduction 150 Degrees    Left Shoulder Internal Rotation --   reaches low t-spine     Strength   Overall Strength Comments 4+/5 for bilateral shoulder flexion, abduction and 5/5 bilateral elbow flexion      Palpation   Palpation comment tender in bicipital groove and pecs of R side      Special Tests    Special Tests Rotator Cuff Impingement    Rotator Cuff Impingment tests Neer impingement test;Hawkins- Kennedy  test;Lift- off test      Neer Impingement test    Findings Positive    Side Right    Comments mild pain      Hawkins-Kennedy test   Findings Positive    Side Right    Comments mild pain      Lift-Off test   Findings Negative    Comment equal bilaterally                      Objective measurements completed on examination: See above findings.       [facility] Adult PT Treatment/Exercise - 10/09/20 0723      Exercises   Exercises Shoulder      Shoulder Exercises: Standing   External Rotation Strengthening;Right;10 reps    Theraband Level (Shoulder External Rotation) Level 2 (Red)    External Rotation Limitations initially at door and then with bilateral UEs    Internal Rotation Strengthening;Right;10 reps    Theraband Level (Shoulder Internal Rotation) Level 2 (Red)    Retraction Strengthening;Both;12 reps    Retraction Limitations with arms abducted      Shoulder Exercises: Stretch   Corner Stretch Limitations door frame stretch    Wall Stretch - ABduction 1 rep;20 seconds    Wall Stretch - ABduction Limitations R UE    Other Shoulder Stretches standing biceps stretch at table                  PT Education - 10/09/20 1333    Education Details HEP    Person(s) Educated Patient    Methods Explanation;Demonstration;Handout    Comprehension Returned demonstration;Verbalized understanding               PT Long Term Goals - 10/09/20 1328      PT LONG TERM GOAL #1   Title The patient will be indep with HEP.    Time 6    Period Weeks    Target Date 11/20/20      PT LONG TERM GOAL #2   Title The patient will improve FOTO from 66% up to 76% to demo improved functional use of R shoulder.    Time 6    Period Weeks    Target Date 11/20/20      PT LONG TERM GOAL #3   Title The patient will perform full ROM  without c/o pain in R shoulder.    Time 6    Period Weeks    Target Date 11/20/20      PT LONG TERM GOAL #4   Title The patient will  demonstrate lifting 5 lbs to an overhead shelf.    Time 6    Period Weeks    Target Date 11/20/20      PT LONG TERM GOAL #5   Title The patient will report ability to tolerate work activities with pain < or equal to 2/10.    Time 6    Period Weeks    Target Date 11/20/20                  Plan - 10/09/20 0758    Clinical Impression Statement The patient is a 60 yo female presenting to OP physical therapy with acute R shoulder pain.  She presents with tightness in R bicipital region, tightness in R pectoralis, mild decrease in muscle strength, and pain with lifting activities.  PT to address deficits to return to prior functional status.    Examination-Activity Limitations Reach Overhead;Lift;Carry    Examination-Participation Restrictions Occupation    Stability/Clinical Decision Making Stable/Uncomplicated    Clinical Decision Making Low    Rehab Potential Good    PT Frequency 1x / week    PT Duration 6 weeks    PT Treatment/Interventions Patient/family education;ADLs/Self Care Home Management;Therapeutic exercise;Therapeutic activities;Manual techniques;Dry needling;Electrical Stimulation;Iontophoresis 4mg /ml Dexamethasone;Moist Heat;Cryotherapy;Taping    PT Next Visit Plan progress strengthening activities to tolerance; work on overhead lifting (with low weight) to mimic work environment, and carrying items at work.    Consulted and Agree with Plan of Care Patient           Patient will benefit from skilled therapeutic intervention in order to improve the following deficits and impairments:  Pain,Impaired flexibility,Increased fascial restricitons,Decreased strength,Decreased activity tolerance  Visit Diagnosis: Acute pain of right shoulder  Abnormal posture     Problem List Patient Active Problem List   Diagnosis Date Noted  . Acute shoulder bursitis, right 09/25/2020  . Hepatic steatosis 08/06/2019  . Nuclear sclerotic cataract of both eyes 10/28/2018  .  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 Retinal edema 10/28/2018  . Hallux rigidus of left foot 06/26/2017  . Macromastia 09/05/2016  . Left elbow pain 07/12/2016  . Left lumbar radiculitis 07/12/2016  . Essential hypertension 02/26/2016  . Hyperlipidemia 03/24/2015  . Rotator cuff syndrome of left shoulder 04/23/2013  . Disorder of bursae and tendons in shoulder region 04/23/2013  . CARPAL TUNNEL SYNDROME 05/03/2009  . POSTMENOPAUSAL STATUS 01/20/2008  . OVARIAN MASS 12/31/2006  . Migraine headache 04/08/2006    Soni Kegel , PT 10/09/2020, 1:33 PM  [facility] [address] [place], [place], [place] Phone: [phone]   Fax:  [phone]  Name: Wanda Ortiz MRN: [name] Date of Birth: [birth year]

## 2020-10-09 NOTE — Patient Instructions (Signed)
Access Code: [email or web address] URL: [email or web address] Date: 10/09/2020 Prepared by: Margretta Ditty  Exercises Doorway Pec Stretch at 60 Elevation - 2 x daily - 7 x weekly - 1 sets - 2 reps - 30 seconds hold Doorway Pec Stretch at 120 Degrees Abduction - 2 x daily - 7 x weekly - 1 sets - 2 reps - 30 seconds hold Standing Bicep Stretch at Wall - 2 x daily - 7 x weekly - 1 sets - 10 reps Shoulder External Rotation and Scapular Retraction with Resistance - 2 x daily - 7 x weekly - 2 sets - 12 reps Standing Scapular Retraction in Abduction - 2 x daily - 7 x weekly - 2 sets - 12 reps

## 2020-10-11 ENCOUNTER — Encounter: Payer: PRIVATE HEALTH INSURANCE | Admitting: Rehabilitative and Restorative Service Providers"

## 2020-10-16 ENCOUNTER — Encounter: Payer: PRIVATE HEALTH INSURANCE | Admitting: Rehabilitative and Restorative Service Providers"

## 2020-10-18 ENCOUNTER — Encounter: Payer: PRIVATE HEALTH INSURANCE | Admitting: Rehabilitative and Restorative Service Providers"

## 2020-10-24 ENCOUNTER — Ambulatory Visit: Payer: PRIVATE HEALTH INSURANCE | Admitting: Sports Medicine

## 2020-10-27 ENCOUNTER — Ambulatory Visit: Payer: PRIVATE HEALTH INSURANCE | Admitting: Sports Medicine

## 2020-11-14 ENCOUNTER — Encounter: Payer: Self-pay | Admitting: Family Medicine

## 2020-11-14 ENCOUNTER — Telehealth (INDEPENDENT_AMBULATORY_CARE_PROVIDER_SITE_OTHER): Payer: PRIVATE HEALTH INSURANCE | Admitting: Family Medicine

## 2020-11-14 DIAGNOSIS — U071 COVID-19: Secondary | ICD-10-CM | POA: Diagnosis not present

## 2020-11-14 NOTE — Progress Notes (Signed)
Virtual Video Visit via MyChart Note  I connected with  [name] on 11/14/20 at  8:10 AM EDT by the video enabled telemedicine application for MyChart, and verified that I am speaking with the correct person using two identifiers.   I introduced myself as a Publishing rights manager with the practice. We discussed the limitations of evaluation and management by telemedicine and the availability of in person appointments. The patient expressed understanding and agreed to proceed.  Participating parties in this visit include: The patient and the nurse practitioner listed. The patient is: At home I am: In the office - Primary [facility]  Subjective:    CC:  Chief Complaint  Patient presents with  . URI    COVID+    HPI: Wanda Ortiz is a 60 y.o. year old female presenting today via MyChart today for COVID.  Patient states she had a positive COVID test on Sunday at work (required to test daily; multiple coworkers also positive). Symptoms have been tolerable so far: decreased appetite, sinus pressure, headache, fever/chills, body aches, unusual taste, fatigue. Worst symptoms is fatigue. She denies any chest pain, trouble breathing, GI symptoms, dehydration symptoms, confusion. Reports she has OTC cough/cold and analgesics. She is fully vaccinated with booster. Lives with family, but self-quarantining in master bedroom. Has already had COVID once and did well.     Past medical history, Surgical history, Family history not pertinant except as noted below, Social history, Allergies, and medications have been entered into the medical record, reviewed, and corrections made.   Review of Systems:  All review of systems negative except what is listed in the HPI   Objective:    General:  Speaking clearly in complete sentences. Absent shortness of breath noted.   Alert and oriented x3.   Normal judgment.  Absent acute distress.   Impression and Recommendations:    1.  COVID-19 Patient COVID positive on Sunday. Seems to being doing alright overall - no red flags today. Recommend she continue supportive care/symptom management and follow quarantine guidelines (attaching handout to AVS). Educated on signs and symptoms requiring further evaluation. Not at high-risk for hospitalization.   Follow-up if symptoms worsen or fail to improve.    I discussed the assessment and treatment plan with the patient. The patient was provided an opportunity to ask questions and all were answered. The patient agreed with the plan and demonstrated an understanding of the instructions.   The patient was advised to call back or seek an in-person evaluation if the symptoms worsen or if the condition fails to improve as anticipated.  I provided 20 minutes of non-face-to-face interaction with this MYCHART visit including intake, same-day documentation, and chart review.   Clayborne Dana, NP

## 2020-11-14 NOTE — Patient Instructions (Signed)
Over the counter medications that may be helpful for symptoms:  . Guaifenesin 1200 mg extended release tabs twice daily, with plenty of water o For cough and congestion o Brand name: Mucinex   . Pseudoephedrine 30 mg, one or two tabs every 4 to 6 hours o For sinus congestion o Brand name: Sudafed o You must get this from the pharmacy counter.  . Oxymetazoline nasal spray each morning, one spray in each nostril, for NO MORE THAN 3 days  o For nasal and sinus congestion o Brand name: Afrin . Saline nasal spray or Saline Nasal Irrigation 3-5 times a day o For nasal and sinus congestion o Brand names: Ocean or AYR . Fluticasone nasal spray, one spray in each nostril, each morning (after oxymetazoline and saline, if used) o For nasal and sinus congestion o Brand name: Flonase . Warm salt water gargles  o For sore throat o Every few hours as needed . Alternate ibuprofen 400-600 mg and acetaminophen 1000 mg every 4-6 hours o For fever, body aches, headache o Brand names: Motrin or Advil and Tylenol . Dextromethorphan 12-hour cough version 30 mg every 12 hours  o For cough o Brand name: Delsym Stop all other cold medications for now (Nyquil, Dayquil, Tylenol Cold, Theraflu, etc) and other non-prescription cough/cold preparations. Many of these have the same ingredients listed above and could cause an overdose of medication.   Herbal treatments that have been shown to be helpful in some patients include: Vitamin C 1000mg per day Vitamin D 4000iU per day Zinc 100mg per day Quercetin 25-500mg twice a day Melatonin 5-10mg at bedtime  General Instructions . Allow your body to rest . Drink PLENTY of fluids . Isolate yourself from everyone, even family, until test results have returned  If your COVID-19 test is positive . Then you ARE INFECTED and you can pass the virus to others . You must quarantine from others for a minimum of  o 10 days since symptoms started AND o You are fever  free for 24 hours WITHOUT any medication to reduce fever AND o Your symptoms are improving . Do not go to the store or other public areas . Do not go around household members who are not known to be infected with COVID-19 . If you MUST leave your area of quarantine (example: go to a bathroom you share with others in your home), you must o Wear a mask o Wash your hands thoroughly o Wipe down any surfaces you touch . Do not share food, drinks, towels, or other items with other persons . Dispose of your own tissues, food containers, etc  Once you have recovered, please continue good preventive care measures, including:  . wearing a mask when in public . wash your hands frequently . avoid touching your face/nose/eyes . cover coughs/sneezes with the inside of your elbow . stay out of crowds . keep a 6 foot distance from others  If you develop severe shortness of breath, uncontrolled fevers, coughing up blood, confusion, chest pain, or signs of dehydration (such as significantly decreased urine amounts or dizziness with standing) please go to the ER.  

## 2020-12-21 ENCOUNTER — Ambulatory Visit: Payer: PRIVATE HEALTH INSURANCE | Admitting: Family Medicine

## 2020-12-25 ENCOUNTER — Telehealth: Payer: Self-pay | Admitting: General Practice

## 2020-12-25 NOTE — Telephone Encounter (Signed)
Transition Care Management Unsuccessful Follow-up Telephone Call  Date of discharge and from where:  12/25/20 from [facility]   Attempts:  1st Attempt  Reason for unsuccessful TCM follow-up call:  Left voice message

## 2020-12-26 NOTE — Telephone Encounter (Signed)
Transition Care Management Unsuccessful Follow-up Telephone Call  Date of discharge and from where:  12/25/20 from [facility]  Attempts:  2nd Attempt  Reason for unsuccessful TCM follow-up call:  Left voice message

## 2020-12-27 NOTE — Telephone Encounter (Signed)
Transition Care Management Unsuccessful Follow-up Telephone Call  Date of discharge and from where:  12/25/20 from [facility]  Attempts:  3rd Attempt  Reason for unsuccessful TCM follow-up call:  Left voice message

## 2020-12-31 ENCOUNTER — Emergency Department
Admission: EM | Admit: 2020-12-31 | Discharge: 2020-12-31 | Disposition: A | Discharge status: Home / Self Care | Payer: PRIVATE HEALTH INSURANCE | Attending: Emergency Medicine | Admitting: Emergency Medicine

## 2020-12-31 ENCOUNTER — Encounter: Payer: Self-pay | Admitting: Emergency Medicine

## 2020-12-31 ENCOUNTER — Other Ambulatory Visit: Payer: Self-pay

## 2020-12-31 DIAGNOSIS — Z79899 Other long term (current) drug therapy: Secondary | ICD-10-CM | POA: Insufficient documentation

## 2020-12-31 DIAGNOSIS — Z87891 Personal history of nicotine dependence: Secondary | ICD-10-CM | POA: Insufficient documentation

## 2020-12-31 DIAGNOSIS — I10 Essential (primary) hypertension: Secondary | ICD-10-CM | POA: Insufficient documentation

## 2020-12-31 DIAGNOSIS — N390 Urinary tract infection, site not specified: Secondary | ICD-10-CM | POA: Insufficient documentation

## 2020-12-31 DIAGNOSIS — R350 Frequency of micturition: Secondary | ICD-10-CM | POA: Diagnosis present

## 2020-12-31 LAB — BASIC METABOLIC PANEL
Anion gap: 9 (ref 5–15)
BUN: 13 mg/dL (ref 6–20)
CO2: 25 mmol/L (ref 22–32)
Calcium: 8.8 mg/dL — ABNORMAL LOW (ref 8.9–10.3)
Chloride: 110 mmol/L (ref 98–111)
Creatinine, Ser: 0.89 mg/dL (ref 0.44–1.00)
GFR, Estimated: >60 mL/min (ref >60)
Glucose, Bld: 151 mg/dL — ABNORMAL HIGH (ref 70–99)
Potassium: 3.2 mmol/L — ABNORMAL LOW (ref 3.5–5.1)
Sodium: 144 mmol/L (ref 135–145)

## 2020-12-31 LAB — CBC
HCT: 43.1 % (ref 36.0–46.0)
Hemoglobin: 14.3 g/dL (ref 12.0–15.0)
MCH: 29 pg (ref 26.0–34.0)
MCHC: 33.2 g/dL (ref 30.0–36.0)
MCV: 87.4 fL (ref 80.0–100.0)
Platelets: 195 10*3/uL (ref 150–400)
RBC: 4.93 MIL/uL (ref 3.87–5.11)
RDW: 14 % (ref 11.5–15.5)
WBC: 13.6 10*3/uL — ABNORMAL HIGH (ref 4.0–10.5)
nRBC: 0 % (ref 0.0–0.2)

## 2020-12-31 LAB — URINALYSIS, COMPLETE (UACMP) WITH MICROSCOPIC
Bacteria, UA: NONE SEEN
Bilirubin Urine: NEGATIVE
Glucose, UA: NEGATIVE mg/dL
Ketones, ur: NEGATIVE mg/dL
Nitrite: NEGATIVE
Protein, ur: 100 mg/dL — AB
RBC / HPF: >50 RBC/hpf — ABNORMAL HIGH (ref 0–5)
Specific Gravity, Urine: 1.025 (ref 1.005–1.030)
WBC, UA: >50 WBC/hpf — ABNORMAL HIGH (ref 0–5)
pH: 5 (ref 5.0–8.0)

## 2020-12-31 MED ORDER — LEVOFLOXACIN 500 MG PO TABS: 500.0000 mg | ORAL_TABLET | Freq: Every day | ORAL | 0 refills | Status: AC

## 2020-12-31 MED ORDER — PHENAZOPYRIDINE HCL 200 MG PO TABS: 200.0000 mg | ORAL_TABLET | Freq: Three times a day (TID) | ORAL | 0 refills | Status: AC | PRN

## 2020-12-31 NOTE — ED Notes (Signed)
Pt states she woke around 7am this morning and noted painful urination and urinary frequency x10+ with hematuria. She has had UTI previously but 10-15 years ago, nothing recent.

## 2020-12-31 NOTE — Discharge Instructions (Addendum)
Follow-up with your regular doctor if not improving in 2 to 3 days.  Return emergency department worsening.  Take your medication as prescribed.

## 2020-12-31 NOTE — ED Provider Notes (Signed)
[facility] Emergency Department Provider Note  ____________________________________________   Event Date/Time   First MD Initiated Contact with Patient 12/31/20 1712     (approximate)  I have reviewed the triage vital signs and the nursing notes.   HISTORY  Chief Complaint Urinary Frequency    HPI Wanda Ortiz is a 60 y.o. female presents emergency department with UTI symptoms.  States she has burning and urgency.  No vaginal discharge.  No fever or chills.  No nausea or vomiting.  Past Medical History:  Diagnosis Date   AR (allergic rhinitis)    Hyperlipemia    Hypertension    Migraines     Patient Active Problem List   Diagnosis Date Noted   Acute shoulder bursitis, right 09/25/2020   Hepatic steatosis 08/06/2019   Nuclear sclerotic cataract of both eyes 10/28/2018   Peripheral focal chorioretinal inflammation of both eyes 10/28/2018   Retinal edema 10/28/2018   Hallux rigidus of left foot 06/26/2017   Macromastia 09/05/2016   Left elbow pain 07/12/2016   Left lumbar radiculitis 07/12/2016   Essential hypertension 02/26/2016   Hyperlipidemia 03/24/2015   Rotator cuff syndrome of left shoulder 04/23/2013   Disorder of bursae and tendons in shoulder region 04/23/2013   CARPAL TUNNEL SYNDROME 05/03/2009   POSTMENOPAUSAL STATUS 01/20/2008   OVARIAN MASS 12/31/2006   Migraine headache 04/08/2006    Past Surgical History:  Procedure Laterality Date   ABDOMINAL HYSTERECTOMY     partial with BSO for [facility]    Prior to Admission medications   Medication Sig Start Date End Date Taking? Authorizing Provider  levofloxacin (LEVAQUIN) 500 MG tablet Take 1 tablet (500 mg total) by mouth daily for 7 days. 12/31/20 01/07/21 Yes Josslynn Mentzer, Roselyn Bering, PA-C  phenazopyridine (PYRIDIUM) 200 MG tablet Take 1 tablet (200 mg total) by mouth 3 (three) times daily as needed for up to 2 days for pain. 12/31/20 01/02/21 Yes Jmarion Christiano, Roselyn Bering, PA-C   albuterol (VENTOLIN HFA) 108 (90 Base) MCG/ACT inhaler Inhale 2 puffs into the lungs every 6 (six) hours as needed for wheezing or shortness of breath. 10/02/20   Agapito Games, MD  buPROPion (WELLBUTRIN XL) 150 MG 24 hr tablet Take 1 tablet (150 mg total) by mouth daily. 09/19/20   Agapito Games, MD  cetirizine-pseudoephedrine (ZYRTEC-D) 5-120 MG per tablet Take 1 tablet by mouth daily as needed.    [provider]  chlorpheniramine-HYDROcodone (TUSSIONEX PENNKINETIC ER) 10-8 MG/5ML SUER Take 5 mLs by mouth every 12 (twelve) hours as needed for cough. 10/02/20   Agapito Games, MD  folic acid (FOLVITE) 1 MG tablet Take by mouth daily.     Eber Jones, MD  methotrexate (RHEUMATREX) 2.5 MG tablet Take 4 tablets by mouth once a week. 11/16/18   [provider]  naratriptan (AMERGE) 2.5 MG tablet Take 1 tablet (2.5 mg total) by mouth as needed. Take one (1) tablet at onset of headache; if returns or does not resolve, may repeat after 4 hours; do not exceed five (5) mg in 24 hours. 11/20/18   Agapito Games, MD  pravastatin (PRAVACHOL) 40 MG tablet Take 1 tablet (40 mg total) by mouth daily. 08/09/19   Agapito Games, MD    Allergies Fish allergy and Shellfish allergy  Family History  Problem Relation Age of Onset   Diabetes Mother    Hyperlipidemia Mother    Hypertension Father    Cancer Father  prostate    Social History Social History   Tobacco Use   Smoking status: Former    Packs/day: 0.30    Years: 15.00    Pack years: 4.50    Types: Cigarettes    Quit date: 11/30/2018    Years since quitting: 2.0   Smokeless tobacco: Never  Substance Use Topics   Alcohol use: Yes    Alcohol/week: 2.0 standard drinks    Types: 2 Standard drinks or equivalent per week   Drug use: No    Review of Systems  Constitutional: No fever/chills Eyes: No visual changes. ENT: No sore throat. Respiratory: Denies cough Cardiovascular: Denies  chest pain Gastrointestinal: Denies abdominal pain Genitourinary: Positive for dysuria. Musculoskeletal: Negative for back pain. Skin: Negative for rash. Psychiatric: no mood changes,     ____________________________________________   PHYSICAL EXAM:  VITAL SIGNS: ED Triage Vitals  Enc Vitals Group     BP 12/31/20 1346 (!) 152/99     Pulse Rate 12/31/20 1346 (!) 105     Resp 12/31/20 1346 18     Temp 12/31/20 1346 99.5 F (37.5 C)     Temp Source 12/31/20 1346 Oral     SpO2 12/31/20 1346 96 %     Weight 12/31/20 1346 188 lb (85.3 kg)     Height 12/31/20 1346  (1.676 m)     Head Circumference --      Peak Flow --      Pain Score 12/31/20 1350 6     Pain Loc --      Pain Edu? --      Excl. in GC? --     Constitutional: Alert and oriented. Well appearing and in no acute distress. Eyes: Conjunctivae are normal.  Head: Atraumatic. Nose: No congestion/rhinnorhea. Mouth/Throat: Mucous membranes are moist.   Neck:  supple no lymphadenopathy noted Cardiovascular: Normal rate, regular rhythm. Heart sounds are normal Respiratory: Normal respiratory effort.  No retractions, lungs c t a  Abd: soft nontender bs normal all 4 quad, no CVA tenderness GU: deferred Musculoskeletal: FROM all extremities, warm and well perfused Neurologic:  Normal speech and language.  Skin:  Skin is warm, dry and intact. No rash noted. Psychiatric: Mood and affect are normal. Speech and behavior are normal.  ____________________________________________   LABS (all labs ordered are listed, but only abnormal results are displayed)  Labs Reviewed  URINALYSIS, COMPLETE (UACMP) WITH MICROSCOPIC - Abnormal; Notable for the following components:      Result Value   Color, Urine YELLOW (*)    APPearance CLOUDY (*)    Hgb urine dipstick LARGE (*)    Protein, ur 100 (*)    Leukocytes,Ua LARGE (*)    RBC / HPF >50 (*)    WBC, UA >50 (*)    All other components within normal limits  BASIC  METABOLIC PANEL - Abnormal; Notable for the following components:   Potassium 3.2 (*)    Glucose, Bld 151 (*)    Calcium 8.8 (*)    All other components within normal limits  CBC - Abnormal; Notable for the following components:   WBC 13.6 (*)    All other components within normal limits  URINE CULTURE   ____________________________________________   ____________________________________________  RADIOLOGY    ____________________________________________   PROCEDURES  Procedure(s) performed: No  Procedures    ____________________________________________   INITIAL IMPRESSION / ASSESSMENT AND PLAN / ED COURSE  Pertinent labs & imaging results that were available during my care of  the patient were reviewed by me and considered in my medical decision making (see chart for details).   The patient is a 60 year old female presents with UTI symptoms.  See HPI.  Physical exam shows patient per stable  DDx: UTI, kidney stone, pyelonephritis  Patient's WBC is elevated at 13.6, urinalysis shows a large amount of leuks, greater than 50 RBCs, greater than 50 WBCs.  Basic metabolic panel is normal.  Urine culture added  The patient has no signs or symptoms of kidney stones.  Do feel this is mainly a urinary tract infection.  She is to follow-up with her regular doctor.  She is given prescription for Levaquin and Pyridium.  Return emergency department worsening.  She is discharged stable condition.     JERYN BERTONI was evaluated in Emergency Department on 12/31/2020 for the symptoms described in the history of present illness. She was evaluated in the context of the global COVID-19 pandemic, which necessitated consideration that the patient might be at risk for infection with the SARS-CoV-2 virus that causes COVID-19. Institutional protocols and algorithms that pertain to the evaluation of patients at risk for COVID-19 are in a state of rapid change based on information released by  regulatory bodies including the CDC and federal and state organizations. These policies and algorithms were followed during the patient's care in the ED.    As part of my medical decision making, I reviewed the following data within the electronic MEDICAL RECORD NUMBER Nursing notes reviewed and incorporated, Labs reviewed , Old chart reviewed, Notes from prior ED visits, and [place] Controlled Substance Database  ____________________________________________   FINAL CLINICAL IMPRESSION(S) / ED DIAGNOSES  Final diagnoses:  Acute UTI      NEW MEDICATIONS STARTED DURING THIS VISIT:  Discharge Medication List as of 12/31/2020  5:19 PM     START taking these medications   Details  levofloxacin (LEVAQUIN) 500 MG tablet Take 1 tablet (500 mg total) by mouth daily for 7 days., Starting Sun 12/31/2020, Until Sun 01/07/2021, Normal    phenazopyridine (PYRIDIUM) 200 MG tablet Take 1 tablet (200 mg total) by mouth 3 (three) times daily as needed for up to 2 days for pain., Starting Sun 12/31/2020, Until Tue 01/02/2021 at 2359, Normal         Note:  This document was prepared using Dragon voice recognition software and may include unintentional dictation errors.    Faythe Ghee, PA-C 12/31/20 1759    Shaune Pollack, MD 01/02/21 [phone]

## 2020-12-31 NOTE — ED Triage Notes (Signed)
Pt via POV from home. Pt c/o urinary urgency and hematuria since this AM. Denies buring or foul odor. Pt does state she feels pressure in her pelvic area. Pt is A&Ox4 and NAD.

## 2021-01-02 ENCOUNTER — Other Ambulatory Visit: Payer: Self-pay

## 2021-01-02 ENCOUNTER — Telehealth: Payer: Self-pay | Admitting: General Practice

## 2021-01-02 ENCOUNTER — Encounter: Payer: Self-pay | Admitting: Family Medicine

## 2021-01-02 ENCOUNTER — Ambulatory Visit (INDEPENDENT_AMBULATORY_CARE_PROVIDER_SITE_OTHER): Payer: PRIVATE HEALTH INSURANCE | Admitting: Family Medicine

## 2021-01-02 VITALS — BP 141/91 | HR 103 | Resp 18

## 2021-01-02 DIAGNOSIS — M79672 Pain in left foot: Secondary | ICD-10-CM

## 2021-01-02 MED ORDER — MELOXICAM 15 MG PO TABS: 15.0000 mg | ORAL_TABLET | Freq: Every day | ORAL | 0 refills | Status: DC

## 2021-01-02 NOTE — Patient Instructions (Signed)
Meloxicam for pain management - do not take with any other NSAIDs (Aleve, Ibuprofen, etc). Can take Tylenol if needed.  Work note - out until Monday. Rest, ice, compression, elevation at home this week  If still in significant pain by Thursday afternoon, come get another x-ray. Order is already active so just stop by radiology and we will let you know results. If improving, no need to come back for repeat xray.

## 2021-01-02 NOTE — Progress Notes (Signed)
Acute Office Visit  Subjective:    Patient ID: Wanda Ortiz, female    DOB: [birth year], 60 y.o.   MRN: [id number]  Chief Complaint  Patient presents with   Foot Pain     HPI Patient is in today for hospital follow-up for left foot pain.  Last Monday, patient dropped a 20 pound meat package on her foot at work. She went to the ED and xray as negative for acute fracture. 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 She continues to be in significant pain 9/10 with any weight bearing. She has been trying Tylenol PM at bedtime to help her sleep and Aleve during the day, but neither seems to be helping much. 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 States bruising has resolved, but dorsal foot remains slightly swollen.      Past Medical History:  Diagnosis Date   AR (allergic rhinitis)    Hyperlipemia    Hypertension    Migraines     Past Surgical History:  Procedure Laterality Date   ABDOMINAL HYSTERECTOMY     partial with BSO for [facility] Tumor    Family History  Problem Relation Age of Onset   Diabetes Mother    Hyperlipidemia Mother    Hypertension Father    Cancer Father        prostate    Social History   Socioeconomic History   Marital status: Single    Spouse name: Not on file   Number of children: Not on file   Years of education: Not on file   Highest education level: Not on file  Occupational History   Not on file  Tobacco Use   Smoking status: Former    Packs/day: 0.30    Years: 15.00    Pack years: 4.50    Types: Cigarettes    Quit date: 11/30/2018    Years since quitting: 2.0   Smokeless tobacco: Never  Substance and Sexual Activity   Alcohol use:  Yes    Alcohol/week: 2.0 standard drinks    Types: 2 Standard drinks or equivalent per week   Drug use: No   Sexual activity: Yes  Other Topics Concern   Not on file  Social History Narrative   Not on file   Social Determinants of Health   Financial Resource Strain: Not on file  Food Insecurity: Not on file  Transportation Needs: Not on file  Physical Activity: Not on file  Stress: Not on file  Social Connections: Not on file  Intimate Partner Violence: Not on file    Outpatient Medications Prior to Visit  Medication Sig Dispense Refill   albuterol (VENTOLIN HFA) 108 (90 Base) MCG/ACT inhaler Inhale 2 puffs into the lungs every 6 (six) hours as needed for wheezing or shortness of breath. 180 g 0   buPROPion (WELLBUTRIN XL) 150 MG 24 hr tablet Take 1 tablet (150 mg total) by mouth daily. 30 tablet 1   cetirizine-pseudoephedrine (ZYRTEC-D) 5-120 MG per tablet Take 1 tablet by mouth daily as needed.     chlorpheniramine-HYDROcodone (TUSSIONEX PENNKINETIC ER) 10-8 MG/5ML SUER Take 5 mLs by mouth  every 12 (twelve) hours as needed for cough. 70 mL 0   folic acid (FOLVITE) 1 MG tablet Take by mouth daily.      levofloxacin (LEVAQUIN) 500 MG tablet Take 1 tablet (500 mg total) by mouth daily for 7 days. 10 tablet 0   methotrexate (RHEUMATREX) 2.5 MG tablet Take 4 tablets by mouth once a week.     naratriptan (AMERGE) 2.5 MG tablet Take 1 tablet (2.5 mg total) by mouth as needed. Take one (1) tablet at onset of headache; if returns or does not resolve, may repeat after 4 hours; do not exceed five (5) mg in 24 hours. 10 tablet PRN   phenazopyridine (PYRIDIUM) 200 MG tablet Take 1 tablet (200 mg total) by mouth 3 (three) times daily as needed for up to 2 days for pain. 6 tablet 0   pravastatin (PRAVACHOL) 40 MG tablet Take 1 tablet (40 mg total) by mouth daily. 90 tablet 3   No facility-administered medications prior to visit.    Allergies  Allergen Reactions   Fish Allergy Hives    Shellfish Allergy Hives    Review of Systems All review of systems negative except what is listed in the HPI     Objective:    Physical Exam Vitals reviewed.  Constitutional:      Appearance: Normal appearance.  Cardiovascular:     Pulses: Normal pulses.  Musculoskeletal:        General: Swelling present.       Legs:  Skin:    General: Skin is warm and dry.  Neurological:     General: No focal deficit present.     Mental Status: She is alert and oriented to person, place, and time. Mental status is at baseline.  Psychiatric:        Mood and Affect: Mood normal.        Behavior: Behavior normal.        Thought Content: Thought content normal.        Judgment: Judgment normal.    BP (!) 141/91   Pulse (!) 103   Resp 18   SpO2 97%  Wt Readings from Last 3 Encounters:  12/31/20 188 lb (85.3 kg)  10/02/20 188 lb (85.3 kg)  09/18/20 188 lb (85.3 kg)    Health Maintenance Due  Topic Date Due   Pneumococcal Vaccine 43-18 Years old (1 - PCV) Never done   Zoster Vaccines- Shingrix (1 of 2) Never done   MAMMOGRAM  03/29/2018   COVID-19 Vaccine (3 - Moderna risk series) 12/01/2019    There are no preventive care reminders to display for this patient.   Lab Results  Component Value Date   TSH 0.28 (L) 03/19/2016   Lab Results  Component Value Date   WBC 13.6 (H) 12/31/2020   HGB 14.3 12/31/2020   HCT 43.1 12/31/2020   MCV 87.4 12/31/2020   PLT 195 12/31/2020   Lab Results  Component Value Date   NA 144 12/31/2020   K 3.2 (L) 12/31/2020   CO2 25 12/31/2020   GLUCOSE 151 (H) 12/31/2020   BUN 13 12/31/2020   CREATININE 0.89 12/31/2020   BILITOT 0.4 08/04/2019   ALKPHOS 119 04/14/2018   AST 21 08/04/2019   ALT 19 08/04/2019   PROT 6.9 08/04/2019   ALBUMIN 4.0 03/19/2016   CALCIUM 8.8 (L) 12/31/2020   ANIONGAP 9 12/31/2020   Lab Results  Component Value Date   CHOL 260 (H) 03/19/2016   Lab Results  Component  Value Date   HDL 41 (L) 03/19/2016    Lab Results  Component Value Date   LDLCALC NOT CALC 03/19/2016   Lab Results  Component Value Date   TRIG 409 (H) 03/19/2016   Lab Results  Component Value Date   CHOLHDL 6.3 (H) 03/19/2016   No results found for: HGBA1C     Assessment & Plan:   1. Left foot pain Will go ahead and write out of work until Monday - rest, ice, compression, elevation. Sending in meloxicam (BP recheck much improved - encouraged her to monitor at home). Avoid other NSAIDs while taking; can take tylenol if needed. Post-op shoe provided. If still in significant pain by Thursday evening/Friday morning, come back for repeat x-ray to see if we can see anything better as swelling goes down. Patient aware of signs/symptoms requiring further/urgent evaluation.  - meloxicam (MOBIC) 15 MG tablet; Take 1 tablet (15 mg total) by mouth daily for 14 days.  Dispense: 14 tablet; Refill: 0 - DG Foot Complete Left; Future  Follow-up if symptoms worsen or fail to improve.   Lollie Marrow Reola Calkins, DNP, FNP-C

## 2021-01-02 NOTE — Telephone Encounter (Signed)
Transition Care Management Follow-up Telephone Call Date of discharge and from where: 12/31/20 from [facility] How have you been since you were released from the hospital? Patient already had OV with Hyman Hopes, NP Any questions or concerns? No

## 2021-01-03 LAB — URINE CULTURE: Culture: 80000 — AB

## 2021-01-05 ENCOUNTER — Other Ambulatory Visit: Payer: Self-pay

## 2021-01-05 ENCOUNTER — Ambulatory Visit (INDEPENDENT_AMBULATORY_CARE_PROVIDER_SITE_OTHER): Payer: Self-pay

## 2021-01-05 DIAGNOSIS — M79672 Pain in left foot: Secondary | ICD-10-CM

## 2021-01-08 ENCOUNTER — Other Ambulatory Visit: Payer: Self-pay

## 2021-01-08 ENCOUNTER — Encounter: Payer: Self-pay | Admitting: Family Medicine

## 2021-01-08 ENCOUNTER — Ambulatory Visit (INDEPENDENT_AMBULATORY_CARE_PROVIDER_SITE_OTHER): Payer: Self-pay | Admitting: Family Medicine

## 2021-01-08 DIAGNOSIS — M79672 Pain in left foot: Secondary | ICD-10-CM

## 2021-01-08 MED ORDER — MELOXICAM 15 MG PO TABS
15.0000 mg | ORAL_TABLET | Freq: Every day | ORAL | 0 refills | Status: AC
Start: 2021-01-08 — End: 2021-01-22

## 2021-01-08 NOTE — Progress Notes (Signed)
Acute Office Visit  Subjective:    Patient ID: Wanda Ortiz, female    DOB: 10/07/1960, 60 y.o.   MRN: [id number]  Chief Complaint  Patient presents with   Foot Injury     HPI Patient is in today for foot pain.  Patient seen 01/02/21 after dropping a 20 pound meat package on her foot at work on 12/25/20 that led to ED visit. Initial xray was negative. She worked from home for one day, but had to go back in person for the remainder of the week. At last visit here, she was given a post-op shoe, meloxicam, and RICE instructions for all of last week - off work. If not improved by the end of last week she was told to go get repeat xray once swelling had gone down.   01/05/21 xray was negative for acute fracture.   Today patient states she has not noticed any significant improvement. Reports she took it easy all last week: rest, ice, compression, elevation, post-op shoe with any walking, meloxicam. States she still has some mild swelling and pain 8.5/10 with any weight bearing. At rest and with medication, pain comes down to 5/10. Does not feel like she is able to stand for 10+ hour shifts at work - states no option for seated jobs. Also stating the entire situation is starting to make her anxious.   Reports she is in the process of getting workman's comp paperwork started and has hired a Clinical research associate.     Past Medical History:  Diagnosis Date   AR (allergic rhinitis)    Hyperlipemia    Hypertension    Migraines     Past Surgical History:  Procedure Laterality Date   ABDOMINAL HYSTERECTOMY     partial with BSO for [facility] Tumor    Family History  Problem Relation Age of Onset   Diabetes Mother    Hyperlipidemia Mother    Hypertension Father    Cancer Father        prostate    Social History   Socioeconomic History   Marital status: Single    Spouse name: Not on file   Number of children: Not on file   Years of education: Not on file   Highest education level: Not on  file  Occupational History   Not on file  Tobacco Use   Smoking status: Former    Packs/day: 0.30    Years: 15.00    Pack years: 4.50    Types: Cigarettes    Quit date: 11/30/2018    Years since quitting: 2.1   Smokeless tobacco: Never  Substance and Sexual Activity   Alcohol use: Yes    Alcohol/week: 2.0 standard drinks    Types: 2 Standard drinks or equivalent per week   Drug use: No   Sexual activity: Yes  Other Topics Concern   Not on file  Social History Narrative   Not on file   Social Determinants of Health   Financial Resource Strain: Not on file  Food Insecurity: Not on file  Transportation Needs: Not on file  Physical Activity: Not on file  Stress: Not on file  Social Connections: Not on file  Intimate Partner Violence: Not on file    Outpatient Medications Prior to Visit  Medication Sig Dispense Refill   albuterol (VENTOLIN HFA) 108 (90 Base) MCG/ACT inhaler Inhale 2 puffs into the lungs every 6 (six) hours as needed for wheezing or shortness of breath. 180 g 0   buPROPion (  WELLBUTRIN XL) 150 MG 24 hr tablet Take 1 tablet (150 mg total) by mouth daily. 30 tablet 1   cetirizine-pseudoephedrine (ZYRTEC-D) 5-120 MG per tablet Take 1 tablet by mouth daily as needed.     chlorpheniramine-HYDROcodone (TUSSIONEX PENNKINETIC ER) 10-8 MG/5ML SUER Take 5 mLs by mouth every 12 (twelve) hours as needed for cough. 70 mL 0   folic acid (FOLVITE) 1 MG tablet Take by mouth daily.      methotrexate (RHEUMATREX) 2.5 MG tablet Take 4 tablets by mouth once a week.     naratriptan (AMERGE) 2.5 MG tablet Take 1 tablet (2.5 mg total) by mouth as needed. Take one (1) tablet at onset of headache; if returns or does not resolve, may repeat after 4 hours; do not exceed five (5) mg in 24 hours. 10 tablet PRN   pravastatin (PRAVACHOL) 40 MG tablet Take 1 tablet (40 mg total) by mouth daily. 90 tablet 3   meloxicam (MOBIC) 15 MG tablet Take 1 tablet (15 mg total) by mouth daily for 14 days. 14  tablet 0   No facility-administered medications prior to visit.    Allergies  Allergen Reactions   Fish Allergy Hives   Shellfish Allergy Hives    Review of Systems All review of systems negative except what is listed in the HPI     Objective:    Physical Exam Vitals reviewed.  Constitutional:      Appearance: Normal appearance.  Musculoskeletal:     Comments: Minimal edema to dorsal foot with tenderness to palpation, full ROM but painful  Skin:    General: Skin is warm and dry.     Capillary Refill: Capillary refill takes less than 2 seconds.     Findings: No bruising or rash.  Neurological:     General: No focal deficit present.     Mental Status: She is alert and oriented to person, place, and time. Mental status is at baseline.  Psychiatric:        Mood and Affect: Mood normal.        Behavior: Behavior normal.        Thought Content: Thought content normal.        Judgment: Judgment normal.    There were no vitals taken for this visit. Wt Readings from Last 3 Encounters:  12/31/20 188 lb (85.3 kg)  10/02/20 188 lb (85.3 kg)  09/18/20 188 lb (85.3 kg)    Health Maintenance Due  Topic Date Due   Pneumococcal Vaccine 61-93 Years old (1 - PCV) Never done   Zoster Vaccines- Shingrix (1 of 2) Never done   MAMMOGRAM  03/29/2018   COVID-19 Vaccine (3 - Moderna risk series) 12/01/2019    There are no preventive care reminders to display for this patient.   Lab Results  Component Value Date   TSH 0.28 (L) 03/19/2016   Lab Results  Component Value Date   WBC 13.6 (H) 12/31/2020   HGB 14.3 12/31/2020   HCT 43.1 12/31/2020   MCV 87.4 12/31/2020   PLT 195 12/31/2020   Lab Results  Component Value Date   NA 144 12/31/2020   K 3.2 (L) 12/31/2020   CO2 25 12/31/2020   GLUCOSE 151 (H) 12/31/2020   BUN 13 12/31/2020   CREATININE 0.89 12/31/2020   BILITOT 0.4 08/04/2019   ALKPHOS 119 04/14/2018   AST 21 08/04/2019   ALT 19 08/04/2019   PROT 6.9  08/04/2019   ALBUMIN 4.0 03/19/2016   CALCIUM 8.8 (L)  12/31/2020   ANIONGAP 9 12/31/2020   Lab Results  Component Value Date   CHOL 260 (H) 03/19/2016   Lab Results  Component Value Date   HDL 41 (L) 03/19/2016   Lab Results  Component Value Date   LDLCALC NOT CALC 03/19/2016   Lab Results  Component Value Date   TRIG 409 (H) 03/19/2016   Lab Results  Component Value Date   CHOLHDL 6.3 (H) 03/19/2016   No results found for: HGBA1C     Assessment & Plan:   1. Left foot pain No significant improvement from last week. Will write out of work for one more week - continue rest, elevation, compression, post-op shoe/boot, meloxicam. Recommended follow-up with sports med vs ortho for further evaluation if still not improving. She prefers to see ortho, Dr. Lelon Mast in [place]. Referral placed.  - meloxicam (MOBIC) 15 MG tablet; Take 1 tablet (15 mg total) by mouth daily for 14 days.  Dispense: 14 tablet; Refill: 0 - Ambulatory referral to Orthopedic Surgery  Follow-up as needed.   Lollie Marrow Reola Calkins, DNP, FNP-C

## 2021-01-08 NOTE — Progress Notes (Signed)
MyChart message sent: No fractures on xray.

## 2021-02-02 ENCOUNTER — Other Ambulatory Visit: Payer: Self-pay

## 2021-02-02 ENCOUNTER — Ambulatory Visit (INDEPENDENT_AMBULATORY_CARE_PROVIDER_SITE_OTHER): Payer: PRIVATE HEALTH INSURANCE | Admitting: Family Medicine

## 2021-02-02 ENCOUNTER — Encounter: Payer: Self-pay | Admitting: Family Medicine

## 2021-02-02 VITALS — BP 150/91 | HR 102 | Temp 99.0°F | Resp 17

## 2021-02-02 DIAGNOSIS — E785 Hyperlipidemia, unspecified: Secondary | ICD-10-CM

## 2021-02-02 DIAGNOSIS — J3089 Other allergic rhinitis: Secondary | ICD-10-CM

## 2021-02-02 DIAGNOSIS — I10 Essential (primary) hypertension: Secondary | ICD-10-CM

## 2021-02-02 DIAGNOSIS — R011 Cardiac murmur, unspecified: Secondary | ICD-10-CM

## 2021-02-02 DIAGNOSIS — J209 Acute bronchitis, unspecified: Secondary | ICD-10-CM | POA: Diagnosis not present

## 2021-02-02 MED ORDER — CETIRIZINE-PSEUDOEPHEDRINE ER 5-120 MG PO TB12: 1.0000 | ORAL_TABLET | Freq: Every day | ORAL | 0 refills | Status: DC | PRN

## 2021-02-02 MED ORDER — HYDROCHLOROTHIAZIDE 12.5 MG PO TABS: 12.5000 mg | ORAL_TABLET | Freq: Every day | ORAL | 3 refills | Status: DC

## 2021-02-02 MED ORDER — HYDROCOD POLST-CPM POLST ER 10-8 MG/5ML PO SUER: 5.0000 mL | Freq: Two times a day (BID) | ORAL | 0 refills | Status: AC | PRN

## 2021-02-02 MED ORDER — PRAVASTATIN SODIUM 40 MG PO TABS: 40.0000 mg | ORAL_TABLET | Freq: Every day | ORAL | 3 refills | Status: DC

## 2021-02-02 NOTE — Patient Instructions (Addendum)
Blood pressure is not at goal at for age and co-morbidities.  I recommend restarting HCTZ  In addition they were instructed on the following: - BP goal <130/80 - monitor and log blood pressures at home - check around the same time each day in a relaxed setting - Limit salt to <2000 mg/day - Follow DASH eating plan - limit alcohol to 2 standard drinks per day for men and 1 per day for women - avoid tobacco products - get at least 2 hours of regular aerobic exercise weekly

## 2021-02-02 NOTE — Progress Notes (Signed)
Acute Office Visit  Subjective:    Patient ID: Wanda Ortiz, female    DOB: January 14, 1961, 60 y.o.   MRN: [id number]  Chief Complaint  Patient presents with   Cough     HPI Patient is in today for cough.  Patient reports she started feeling bad on Tuesday and by Wednesday she just wanted to lay down most of the day.  She has taking Zyrtec and Mucinex but does not seem to be helping much.  She has had to take Aleve at night to help her sleep.  Reports the coughing is constant and can be productive; she has noticed some occasional thick, white sputum.  She has had to use her inhaler twice yesterday but none today.  States she does have occasional wheezing at night but it has not been too noticeable.  She denies any sinus pressure, headache, trouble breathing, nasal congestion, fevers, body aches, fatigue, loss of taste or smell.  Reports she gets bronchitis a couple of times per year and usually does okay after few days of cough syrup.       Past Medical History:  Diagnosis Date   AR (allergic rhinitis)    Hyperlipemia    Hypertension    Migraines     Past Surgical History:  Procedure Laterality Date   ABDOMINAL HYSTERECTOMY     partial with BSO for Brenners Tumor    Family History  Problem Relation Age of Onset   Diabetes Mother    Hyperlipidemia Mother    Hypertension Father    Cancer Father        prostate    Social History   Socioeconomic History   Marital status: Single    Spouse name: Not on file   Number of children: Not on file   Years of education: Not on file   Highest education level: Not on file  Occupational History   Not on file  Tobacco Use   Smoking status: Former    Packs/day: 0.30    Years: 15.00    Pack years: 4.50    Types: Cigarettes    Quit date: 11/30/2018    Years since quitting: 2.1   Smokeless tobacco: Never  Substance and Sexual Activity   Alcohol use: Yes    Alcohol/week: 2.0 standard drinks    Types: 2 Standard drinks or  equivalent per week   Drug use: No   Sexual activity: Yes  Other Topics Concern   Not on file  Social History Narrative   Not on file   Social Determinants of Health   Financial Resource Strain: Not on file  Food Insecurity: Not on file  Transportation Needs: Not on file  Physical Activity: Not on file  Stress: Not on file  Social Connections: Not on file  Intimate Partner Violence: Not on file    Outpatient Medications Prior to Visit  Medication Sig Dispense Refill   albuterol (VENTOLIN HFA) 108 (90 Base) MCG/ACT inhaler Inhale 2 puffs into the lungs every 6 (six) hours as needed for wheezing or shortness of breath. 180 g 0   buPROPion (WELLBUTRIN XL) 150 MG 24 hr tablet Take 1 tablet (150 mg total) by mouth daily. 30 tablet 1   cetirizine-pseudoephedrine (ZYRTEC-D) 5-120 MG per tablet Take 1 tablet by mouth daily as needed.     chlorpheniramine-HYDROcodone (TUSSIONEX PENNKINETIC ER) 10-8 MG/5ML SUER Take 5 mLs by mouth every 12 (twelve) hours as needed for cough. 70 mL 0   folic acid (FOLVITE) 1  MG tablet Take by mouth daily.      methotrexate (RHEUMATREX) 2.5 MG tablet Take 4 tablets by mouth once a week.     naratriptan (AMERGE) 2.5 MG tablet Take 1 tablet (2.5 mg total) by mouth as needed. Take one (1) tablet at onset of headache; if returns or does not resolve, may repeat after 4 hours; do not exceed five (5) mg in 24 hours. 10 tablet PRN   pravastatin (PRAVACHOL) 40 MG tablet Take 1 tablet (40 mg total) by mouth daily. 90 tablet 3   No facility-administered medications prior to visit.    Allergies  Allergen Reactions   Fish Allergy Hives   Shellfish Allergy Hives    Review of Systems All review of systems negative except what is listed in the HPI     Objective:    Physical Exam Vitals reviewed.  Constitutional:      General: She is not in acute distress.    Appearance: Normal appearance.  HENT:     Head: Normocephalic and atraumatic.  Neck:     Vascular: No  carotid bruit.  Cardiovascular:     Rate and Rhythm: Normal rate and regular rhythm.     Heart sounds: Murmur heard.  Pulmonary:     Effort: Pulmonary effort is normal.     Breath sounds: Normal breath sounds. No wheezing.  Abdominal:     Palpations: Abdomen is soft.  Musculoskeletal:     Cervical back: Normal range of motion.     Right lower leg: No edema.     Left lower leg: No edema.  Skin:    General: Skin is warm and dry.     Capillary Refill: Capillary refill takes less than 2 seconds.  Neurological:     General: No focal deficit present.     Mental Status: She is alert and oriented to person, place, and time.  Psychiatric:        Mood and Affect: Mood normal.        Behavior: Behavior normal.        Thought Content: Thought content normal.        Judgment: Judgment normal.    BP (!) 159/92   Pulse (!) 102   Temp 99 F (37.2 C)   Resp 17   SpO2 94%  Wt Readings from Last 3 Encounters:  12/31/20 188 lb (85.3 kg)  10/02/20 188 lb (85.3 kg)  09/18/20 188 lb (85.3 kg)    Health Maintenance Due  Topic Date Due   Pneumococcal Vaccine 84-6 Years old (1 - PCV) Never done   Zoster Vaccines- Shingrix (1 of 2) Never done   MAMMOGRAM  03/29/2018   COVID-19 Vaccine (3 - Moderna risk series) 12/01/2019   INFLUENZA VACCINE  01/29/2021    There are no preventive care reminders to display for this patient.   Lab Results  Component Value Date   TSH 0.28 (L) 03/19/2016   Lab Results  Component Value Date   WBC 13.6 (H) 12/31/2020   HGB 14.3 12/31/2020   HCT 43.1 12/31/2020   MCV 87.4 12/31/2020   PLT 195 12/31/2020   Lab Results  Component Value Date   NA 144 12/31/2020   K 3.2 (L) 12/31/2020   CO2 25 12/31/2020   GLUCOSE 151 (H) 12/31/2020   BUN 13 12/31/2020   CREATININE 0.89 12/31/2020   BILITOT 0.4 08/04/2019   ALKPHOS 119 04/14/2018   AST 21 08/04/2019   ALT 19 08/04/2019   PROT 6.9  08/04/2019   ALBUMIN 4.0 03/19/2016   CALCIUM 8.8 (L) 12/31/2020    ANIONGAP 9 12/31/2020   Lab Results  Component Value Date   CHOL 260 (H) 03/19/2016   Lab Results  Component Value Date   HDL 41 (L) 03/19/2016   Lab Results  Component Value Date   LDLCALC NOT CALC 03/19/2016   Lab Results  Component Value Date   TRIG 409 (H) 03/19/2016   Lab Results  Component Value Date   CHOLHDL 6.3 (H) 03/19/2016   No results found for: HGBA1C     Assessment & Plan:   1. Hyperlipidemia, unspecified hyperlipidemia type Cholesterol medication refilled.  Recommend she schedule a physical or basic follow-up with PCP to go over chronic conditions. - pravastatin (PRAVACHOL) 40 MG tablet; Take 1 tablet (40 mg total) by mouth daily.  Dispense: 90 tablet; Refill: 3  2. Acute bronchitis, unspecified organism We will go ahead and give her a few days of Tussionex.  Recommend she try to save this for bedtime use.  Patient encouraged to continue supportive measures including rest, hydration, humidifier use, warm liquids with honey and lemon, over-the-counter cough and cold medicines. - chlorpheniramine-HYDROcodone (TUSSIONEX PENNKINETIC ER) 10-8 MG/5ML SUER; Take 5 mLs by mouth every 12 (twelve) hours as needed for up to 5 days for cough.  Dispense: 50 mL; Refill: 0 he does well I would never like goes to sleep  3. Murmur New murmur heard on exam today.  Patient states she is never been told she has a murmur.  It has been a while since she has been seen in person other than orthopedic complaints.  EKG in office today revealed possible LAE and LVH.  We will go ahead and order echo and cardiology consult.  Patient denies any chest pain, dyspnea, edema, dizziness or lightheadedness. - EKG 12-Lead - ECHOCARDIOGRAM COMPLETE; Future - Ambulatory referral to Cardiology  4. Allergic rhinitis due to other allergic trigger, unspecified seasonality Refilling Zyrtec today.  Recommend she try to avoid the Sudafed component as a regular medication as this can raise her blood  pressure.  She can use this occasionally, but should be taking regular Zyrtec on a daily basis instead of Zyrtec-D daily. - cetirizine-pseudoephedrine (ZYRTEC-D) 5-120 MG tablet; Take 1 tablet by mouth daily as needed.  Dispense: 30 tablet; Refill: 0  5. Essential hypertension BP has been elevated at the past several appointments.  In the past she has been on HCTZ however her BP became well controlled and she was doing fine without meds.  She does not check her BP at home.  Past 3 visits have shown BPs greater than 140/90.  Patient is agreeable to restart HCTZ as she tolerated this well in the past.  Will start low-dose for now.  Recommend she try to check her blood pressure occasionally at home, limit sodium, and follow heart healthy diet.  Recommend exercise at least 5 days/week.  She needs to come back in about 2 weeks for a nurse visit blood pressure check. - hydrochlorothiazide (HYDRODIURIL) 12.5 MG tablet; Take 1 tablet (12.5 mg total) by mouth daily.  Dispense: 30 tablet; Refill: 3    Follow-up: Nurse visit in 1 to 2 weeks for BP check after restarting HCTZ.  Recommend she see PCP in the next month or two for chronic disease management or CPE as it has been a while since she has seen her in person.  Patient aware of signs/symptoms requiring further/urgent evaluation.   Lollie Marrow Reola Calkins, DNP, FNP-C

## 2021-02-16 ENCOUNTER — Ambulatory Visit (INDEPENDENT_AMBULATORY_CARE_PROVIDER_SITE_OTHER): Payer: Medicaid Other | Admitting: Family Medicine

## 2021-02-16 ENCOUNTER — Other Ambulatory Visit: Payer: Self-pay

## 2021-02-16 VITALS — BP 132/85 | HR 89

## 2021-02-16 DIAGNOSIS — I10 Essential (primary) hypertension: Secondary | ICD-10-CM | POA: Diagnosis not present

## 2021-02-16 NOTE — Progress Notes (Signed)
Pt in for BP check this morning.  First reading was 139/87.  After sitting for 10 min her reading was 132/85.

## 2021-02-16 NOTE — Progress Notes (Signed)
Yeah!!!! Much better.  Continue current regimen for now.

## 2021-02-16 NOTE — Progress Notes (Signed)
Pt notified of instructions

## 2021-02-19 ENCOUNTER — Other Ambulatory Visit: Payer: Self-pay

## 2021-02-19 ENCOUNTER — Ambulatory Visit (INDEPENDENT_AMBULATORY_CARE_PROVIDER_SITE_OTHER): Payer: PRIVATE HEALTH INSURANCE | Admitting: Cardiology

## 2021-02-19 ENCOUNTER — Encounter: Payer: Self-pay | Admitting: Cardiology

## 2021-02-19 VITALS — BP 146/98 | HR 93 | Ht 66.0 in | Wt 193.1 lb

## 2021-02-19 DIAGNOSIS — I10 Essential (primary) hypertension: Secondary | ICD-10-CM | POA: Diagnosis not present

## 2021-02-19 DIAGNOSIS — R011 Cardiac murmur, unspecified: Secondary | ICD-10-CM

## 2021-02-19 DIAGNOSIS — E7800 Pure hypercholesterolemia, unspecified: Secondary | ICD-10-CM | POA: Diagnosis not present

## 2021-02-19 DIAGNOSIS — F17200 Nicotine dependence, unspecified, uncomplicated: Secondary | ICD-10-CM | POA: Diagnosis not present

## 2021-02-19 NOTE — Patient Instructions (Signed)
Medication Instructions:  Your physician recommends that you continue on your current medications as directed. Please refer to the Current Medication list given to you today.  *If you need a refill on your cardiac medications before your next appointment, please call your pharmacy*   Lab Work:  Your physician recommends that you return for a FASTING lipid profile:  AT YOUR EARLIEST CONVENIENCE  - You will need to be fasting. Please do not have anything to eat or drink after midnight the morning you have the lab work. You may only have water or black coffee with no cream or sugar.  - Please go to the [facility]. You will check in at the front desk to the right as you walk into the atrium. Valet Parking is offered if needed. - No appointment needed. You may go any day M-F, anytime between 7 am and 6 pm.     Testing/Procedures: None ordered   Follow-Up: At [facility], you and your health needs are our priority.  As part of our continuing mission to provide you with exceptional heart care, we have created designated Provider Care Teams.  These Care Teams include your primary Cardiologist (physician) and Advanced Practice Providers (APPs -  Physician Assistants and Nurse Practitioners) who all work together to provide you with the care you need, when you need it.  We recommend signing up for the patient portal called "MyChart".  Sign up information is provided on this After Visit Summary.  MyChart is used to connect with patients for Virtual Visits (Telemedicine).  Patients are able to view lab/test results, encounter notes, upcoming appointments, etc.  Non-urgent messages can be sent to your provider as well.   To learn more about what you can do with MyChart, go to [email or web address].    Your next appointment:   2-3 weeks   The format for your next appointment:   In Person  Provider:   You may see Dr. Azucena Cecil or one of the following Advanced Practice Providers on  your designated Care Team:   Nicolasa Ducking, NP Eula Listen, PA-C Marisue Ivan, PA-C Cadence Fransico Michael, [place]   Other Instructions

## 2021-02-19 NOTE — Progress Notes (Signed)
Cardiology Office Note:    Date:  02/19/2021   ID:  Wanda Ortiz, DOB December 12, 1960, MRN [id number]  PCP:  Agapito Games, MD   [facility] Providers Cardiologist:  None     Referring MD: Clayborne Dana, NP   Chief Complaint  Patient presents with   Other    Heart Murmur C/O Elevated BP. Meds reviewed verbally with pt.   Wanda Ortiz is a 60 y.o. female who is being seen today for the evaluation of cardiac murmur at the request of Clayborne Dana, NP.   History of Present Illness:    Wanda Ortiz is a 60 y.o. female with a hx of hypertension, hyperlipidemia, current smoker x15+ years who presents due to a cardiac murmur.  Patient was seen in the ED earlier this month due to bronchitis.  A murmur was noted on physical exam.  She denies chest pain, shortness of breath.  Does not check her blood pressure frequently at home.  States feeling well, denies edema.  Has never been told she has a heart murmur.  Her last cholesterol check was over 5 years ago.  Takes medication as prescribed for both cholesterol and blood pressure.  Has been on Pravachol for many years now.  Past Medical History:  Diagnosis Date   AR (allergic rhinitis)    Hyperlipemia    Hypertension    Migraines     Past Surgical History:  Procedure Laterality Date   ABDOMINAL HYSTERECTOMY     partial with BSO for Brenners Tumor    Current Medications: Current Meds  Medication Sig   albuterol (VENTOLIN HFA) 108 (90 Base) MCG/ACT inhaler Inhale 2 puffs into the lungs every 6 (six) hours as needed for wheezing or shortness of breath.   buPROPion (WELLBUTRIN XL) 150 MG 24 hr tablet Take 1 tablet (150 mg total) by mouth daily.   cetirizine-pseudoephedrine (ZYRTEC-D) 5-120 MG tablet Take 1 tablet by mouth daily as needed.   folic acid (FOLVITE) 1 MG tablet Take by mouth daily.    hydrochlorothiazide (HYDRODIURIL) 12.5 MG tablet Take 1 tablet (12.5 mg total) by mouth daily.   methotrexate  (RHEUMATREX) 2.5 MG tablet Take 4 tablets by mouth once a week.   naratriptan (AMERGE) 2.5 MG tablet Take 1 tablet (2.5 mg total) by mouth as needed. Take one (1) tablet at onset of headache; if returns or does not resolve, may repeat after 4 hours; do not exceed five (5) mg in 24 hours.   pravastatin (PRAVACHOL) 40 MG tablet Take 1 tablet (40 mg total) by mouth daily.     Allergies:   Fish allergy and Shellfish allergy   Social History   Socioeconomic History   Marital status: Single    Spouse name: Not on file   Number of children: Not on file   Years of education: Not on file   Highest education level: Not on file  Occupational History   Not on file  Tobacco Use   Smoking status: Every Day    Packs/day: 0.25    Years: 15.00    Pack years: 3.75    Types: Cigarettes    Last attempt to quit: 11/30/2018    Years since quitting: 2.2   Smokeless tobacco: Never  Substance and Sexual Activity   Alcohol use: Yes    Alcohol/week: 2.0 standard drinks    Types: 2 Standard drinks or equivalent per week   Drug use: No   Sexual activity: Yes  Other Topics  Concern   Not on file  Social History Narrative   Not on file   Social Determinants of Health   Financial Resource Strain: Not on file  Food Insecurity: Not on file  Transportation Needs: Not on file  Physical Activity: Not on file  Stress: Not on file  Social Connections: Not on file     Family History: The patient's family history includes Cancer in her father; Diabetes in her mother; Heart disease in her mother; Hyperlipidemia in her mother; Hypertension in her father.  ROS:   Please see the history of present illness.     All other systems reviewed and are negative.  EKGs/Labs/Other Studies Reviewed:    The following studies were reviewed today:   EKG:  EKG is  ordered today.  The ekg ordered today demonstrates normal sinus rhythm, normal ECG.  Recent Labs: 12/31/2020: BUN 13; Creatinine, Ser 0.89; Hemoglobin 14.3;  Platelets 195; Potassium 3.2; Sodium 144  Recent Lipid Panel    Component Value Date/Time   CHOL 260 (H) 03/19/2016 0839   TRIG 409 (H) 03/19/2016 0839   HDL 41 (L) 03/19/2016 0839   CHOLHDL 6.3 (H) 03/19/2016 0839   VLDL NOT CALC 03/19/2016 0839   LDLCALC NOT CALC 03/19/2016 0839   LDLDIRECT 93 01/21/2008 1248     Risk Assessment/Calculations:          Physical Exam:    VS:  BP (!) 146/98 (BP Location: Right Arm, Patient Position: Sitting, Cuff Size: Normal)   Pulse 93   Ht 5\' 6"  (1.676 m)   Wt 193 lb 2 oz (87.6 kg)   SpO2 98%   BMI 31.17 kg/m     Wt Readings from Last 3 Encounters:  02/19/21 193 lb 2 oz (87.6 kg)  12/31/20 188 lb (85.3 kg)  10/02/20 188 lb (85.3 kg)     GEN:  Well nourished, well developed in no acute distress HEENT: Normal NECK: No JVD; No carotid bruits LYMPHATICS: No lymphadenopathy CARDIAC: RRR, 2/6 systolic murmur RESPIRATORY:  Clear to auscultation without rales, wheezing or rhonchi  ABDOMEN: Soft, non-tender, non-distended MUSCULOSKELETAL:  No edema; No deformity  SKIN: Warm and dry NEUROLOGIC:  Alert and oriented x 3 PSYCHIATRIC:  Normal affect   ASSESSMENT:    1. Systolic murmur   2. Primary hypertension   3. Pure hypercholesterolemia   4. Smoking    PLAN:    In order of problems listed above:  2/6 systolic murmur noted on exam.  Has an echocardiogram scheduled evaluate any structural or valvular abnormalities in 1 week.  Advised to obtain echocardiogram as scheduled. Hypertension, BP elevated today, previously controlled.  Continue HCTZ for now.  Advised to check BP at home and keep a log.  If blood pressure elevated at follow-up visit, consider starting ACE/ARB.  Last potassium level low, increasing HCTZ might make that worse. Hyperlipidemia, obtain fasting lipid profile.  Continue Pravachol for now. Current smoker, cessation advised.  Follow-up after echo and lipid panel.     Medication Adjustments/Labs and Tests  Ordered: Current medicines are reviewed at length with the patient today.  Concerns regarding medicines are outlined above.  Orders Placed This Encounter  Procedures   Lipid panel   EKG 12-Lead    No orders of the defined types were placed in this encounter.   Patient Instructions  Medication Instructions:  Your physician recommends that you continue on your current medications as directed. Please refer to the Current Medication list given to you today.  *If  you need a refill on your cardiac medications before your next appointment, please call your pharmacy*   Lab Work:  Your physician recommends that you return for a FASTING lipid profile:  AT YOUR EARLIEST CONVENIENCE  - You will need to be fasting. Please do not have anything to eat or drink after midnight the morning you have the lab work. You may only have water or black coffee with no cream or sugar.  - Please go to the [facility]. You will check in at the front desk to the right as you walk into the atrium. Valet Parking is offered if needed. - No appointment needed. You may go any day M-F, anytime between 7 am and 6 pm.     Testing/Procedures: None ordered   Follow-Up: At [facility], you and your health needs are our priority.  As part of our continuing mission to provide you with exceptional heart care, we have created designated Provider Care Teams.  These Care Teams include your primary Cardiologist (physician) and Advanced Practice Providers (APPs -  Physician Assistants and Nurse Practitioners) who all work together to provide you with the care you need, when you need it.  We recommend signing up for the patient portal called "MyChart".  Sign up information is provided on this After Visit Summary.  MyChart is used to connect with patients for Virtual Visits (Telemedicine).  Patients are able to view lab/test results, encounter notes, upcoming appointments, etc.  Non-urgent messages can be sent to your  provider as well.   To learn more about what you can do with MyChart, go to [email or web address].    Your next appointment:   2-3 weeks   The format for your next appointment:   In Person  Provider:   You may see Dr. Azucena Cecil or one of the following Advanced Practice Providers on your designated Care Team:   Nicolasa Ducking, NP Eula Listen, PA-C Marisue Ivan, PA-C Cadence [place], [place]   Other Instructions    Signed, Debbe Odea, MD  02/19/2021 5:08 PM    [facility]

## 2021-02-26 ENCOUNTER — Ambulatory Visit (HOSPITAL_BASED_OUTPATIENT_CLINIC_OR_DEPARTMENT_OTHER)
Admission: RE | Admit: 2021-02-26 | Discharge: 2021-02-26 | Disposition: A | Discharge status: Home / Self Care | Payer: PRIVATE HEALTH INSURANCE | Source: Ambulatory Visit | Attending: Family Medicine | Admitting: Family Medicine

## 2021-02-26 ENCOUNTER — Other Ambulatory Visit: Payer: Self-pay

## 2021-02-26 DIAGNOSIS — R011 Cardiac murmur, unspecified: Secondary | ICD-10-CM | POA: Insufficient documentation

## 2021-02-27 LAB — ECHOCARDIOGRAM COMPLETE
Area-P 1/2: 2.78 cm2
Calc EF: 63.9 %
MV M vel: 2.6 m/s
MV Peak grad: 27 mmHg
S' Lateral: 3.1 cm
Single Plane A2C EF: 66.9 %
Single Plane A4C EF: 58.8 %

## 2021-02-27 NOTE — Progress Notes (Signed)
MyChart message sent: Your echo did show some regurgitation (back flow) at one of the valves in your heart. Cardiology will be reviewing this with you at your follow-up appointment with them in a few weeks. Please let us know if you have any questions or concerns in the meantime.

## 2021-02-28 ENCOUNTER — Ambulatory Visit: Payer: Medicaid Other | Admitting: Sports Medicine

## 2021-03-05 ENCOUNTER — Other Ambulatory Visit
Admission: RE | Admit: 2021-03-05 | Discharge: 2021-03-05 | Disposition: A | Discharge status: Home / Self Care | Payer: PRIVATE HEALTH INSURANCE | Source: Ambulatory Visit | Attending: Cardiology | Admitting: Cardiology

## 2021-03-05 DIAGNOSIS — E7800 Pure hypercholesterolemia, unspecified: Secondary | ICD-10-CM

## 2021-03-05 LAB — LIPID PANEL
Cholesterol: 243 mg/dL — ABNORMAL HIGH (ref 0–200)
HDL: 41 mg/dL (ref >40)
LDL Cholesterol: 130 mg/dL — ABNORMAL HIGH (ref 0–99)
Total CHOL/HDL Ratio: 5.9 RATIO
Triglycerides: 362 mg/dL — ABNORMAL HIGH (ref <150)
VLDL: 72 mg/dL — ABNORMAL HIGH (ref 0–40)

## 2021-03-06 ENCOUNTER — Ambulatory Visit: Payer: Self-pay

## 2021-03-06 ENCOUNTER — Ambulatory Visit (INDEPENDENT_AMBULATORY_CARE_PROVIDER_SITE_OTHER): Payer: PRIVATE HEALTH INSURANCE | Admitting: Sports Medicine

## 2021-03-06 DIAGNOSIS — M7551 Bursitis of right shoulder: Secondary | ICD-10-CM

## 2021-03-06 NOTE — Progress Notes (Signed)
    Procedures performed today:    Procedure: Real-time Ultrasound Guided injection of the right subacromial bursa Device: Samsung HS60  Verbal informed consent obtained.  Time-out conducted.  Noted no overlying erythema, induration, or other signs of local infection.  Skin prepped in a sterile fashion.  Local anesthesia: Topical Ethyl chloride.  With sterile technique and under real time ultrasound guidance: Noted intact cuff, 1 cc Kenalog 40, 1 cc lidocaine, 1 cc bupivacaine injected easily Completed without difficulty  Advised to call if fevers/chills, erythema, induration, drainage, or persistent bleeding.  Images permanently stored and available for review in PACS.  Impression: Technically successful ultrasound guided injection.  Independent interpretation of notes and tests performed by another provider:   None.  Brief History, Exam, Impression, and Recommendations:    Acute shoulder bursitis, right Della did well after a subacromial injection in March, she has recurrence of pain, localized over the deltoid worse with abduction, repeat right subacromial injection today. X-rays in the past have shown acromioclavicular including glenohumeral osteoarthritis. Continue home conditioning, return as needed.  Chronic process with exacerbation and pharmacologic intervention  ___________________________________________ Ihor Austin. Benjamin Stain, M.D., ABFM., CAQSM. Primary Care and Sports Medicine [facility]  Adjunct Instructor of Family Medicine  [employer]

## 2021-03-06 NOTE — Assessment & Plan Note (Addendum)
Wanda Ortiz did well after a subacromial injection in March, she has recurrence of pain, localized over the deltoid worse with abduction, repeat right subacromial injection today. X-rays in the past have shown acromioclavicular including glenohumeral osteoarthritis. Continue home conditioning, return as needed.

## 2021-03-09 ENCOUNTER — Telehealth: Payer: Self-pay

## 2021-03-09 MED ORDER — ATORVASTATIN CALCIUM 40 MG PO TABS: 40.0000 mg | ORAL_TABLET | Freq: Every day | ORAL | 5 refills | Status: DC

## 2021-03-09 NOTE — Telephone Encounter (Signed)
Called patient and discussed her Lipid panel. Patient is willing to try Lipitor. I sent prescription into pharmacy and discontinued Pravachol as recommended.

## 2021-03-09 NOTE — Telephone Encounter (Signed)
-----   Message from Debbe Odea, MD sent at 03/07/2021  6:23 PM EDT ----- Cholesterol  elevated.  Stop Pravachol, start Lipitor 40 mg daily.

## 2021-03-18 NOTE — Progress Notes (Deleted)
Cardiology Office Note:    Date:  03/19/2021   ID:  Wanda Ortiz, DOB 1960-07-05, MRN [id number]  PCP:  Agapito Games, MD  [facility] Cardiologist:  None  [facility] Electrophysiologist:  None   Referring MD: Agapito Games, *   Chief Complaint: over due follow-up  History of Present Illness:    Wanda Ortiz is a 60 y.o. female with a hx of HTN, HLD, tobacco usex15 years who presents for follow-up.   Seen 02/19/21 for murmur, patient was asymptomatic. Echo was ordered which showed LVEF 60-65%, G1DD, severe MR. TEE is recommended, mild dilation of ascending aorta.  Today,    Past Medical History:  Diagnosis Date   AR (allergic rhinitis)    Hyperlipemia    Hypertension    Migraines     Past Surgical History:  Procedure Laterality Date   ABDOMINAL HYSTERECTOMY     partial with BSO for [facility] Tumor    Current Medications: No outpatient medications have been marked as taking for the 03/19/21 encounter (Appointment) with Fransico Michael, Ronelle Michie H, PA-C.     Allergies:   Fish allergy and Shellfish allergy   Social History   Socioeconomic History   Marital status: Single    Spouse name: Not on file   Number of children: Not on file   Years of education: Not on file   Highest education level: Not on file  Occupational History   Not on file  Tobacco Use   Smoking status: Every Day    Packs/day: 0.25    Years: 15.00    Pack years: 3.75    Types: Cigarettes    Last attempt to quit: 11/30/2018    Years since quitting: 2.3   Smokeless tobacco: Never  Substance and Sexual Activity   Alcohol use: Yes    Alcohol/week: 2.0 standard drinks    Types: 2 Standard drinks or equivalent per week   Drug use: No   Sexual activity: Yes  Other Topics Concern   Not on file  Social History Narrative   Not on file   Social Determinants of Health   Financial Resource Strain: Not on file  Food Insecurity: Not on file  Transportation Needs: Not on file   Physical Activity: Not on file  Stress: Not on file  Social Connections: Not on file     Family History: The patient's family history includes Cancer in her father; Diabetes in her mother; Heart disease in her mother; Hyperlipidemia in her mother; Hypertension in her father.  ROS:   Please see the history of present illness.     All other systems reviewed and are negative.  EKGs/Labs/Other Studies Reviewed:    The following studies were reviewed today:  Echo 01/2021   1. Left ventricular ejection fraction, by estimation, is 60 to 65%. The  left ventricle has normal function. The left ventricle has no regional  wall motion abnormalities. There is mild concentric left ventricular  hypertrophy. Left ventricular diastolic  parameters are consistent with Grade I diastolic dysfunction (impaired  relaxation).   2. Right ventricular systolic function is normal. The right ventricular  size is normal.   3. 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 The mitral valve is myxomatous. Severe mitral valve regurgitation.  No evidence of mitral stenosis.   4. TEE is recommended.   5. The aortic valve is tricuspid. Aortic valve regurgitation is not  visualized. No aortic stenosis is present.   6.  There is mild dilatation of the ascending aorta, measuring 36 mm.   7. The inferior vena cava is normal in size with greater than 50%  respiratory variability, suggesting right atrial pressure of 3 mmHg.   EKG:  EKG is *** ordered today.  The ekg ordered today demonstrates ***  Recent Labs: 12/31/2020: BUN 13; Creatinine, Ser 0.89; Hemoglobin 14.3; Platelets 195; Potassium 3.2; Sodium 144  Recent Lipid Panel    Component Value Date/Time   CHOL 243 (H) 03/05/2021 0801   TRIG 362 (H) 03/05/2021 0801   HDL 41 03/05/2021 0801   CHOLHDL 5.9 03/05/2021 0801   VLDL 72 (H) 03/05/2021 0801   LDLCALC 130 (H) 03/05/2021 0801   LDLDIRECT 93 01/21/2008 1248    Physical  Exam:    VS:  There were no vitals taken for this visit.    Wt Readings from Last 3 Encounters:  02/19/21 193 lb 2 oz (87.6 kg)  12/31/20 188 lb (85.3 kg)  10/02/20 188 lb (85.3 kg)     GEN: *** Well nourished, well developed in no acute distress HEENT: Normal NECK: No JVD; No carotid bruits LYMPHATICS: No lymphadenopathy CARDIAC: ***RRR, no murmurs, rubs, gallops RESPIRATORY:  Clear to auscultation without rales, wheezing or rhonchi  ABDOMEN: Soft, non-tender, non-distended MUSCULOSKELETAL:  No edema; No deformity  SKIN: Warm and dry NEUROLOGIC:  Alert and oriented x 3 PSYCHIATRIC:  Normal affect   ASSESSMENT:    1. Severe mitral regurgitation   2. Essential hypertension   3. Hyperlipidemia, mixed   4. Smoking    PLAN:    In order of problems listed above:  Murmur  HTN  HLD  Tobacco use  Disposition: Follow up {follow up:15908} with ***   Shared Decision Making/Informed Consent   {Are you ordering a CV Procedure (e.g. stress test, cath, DCCV, TEE, etc)?   Press F2        :[id number]}    Signed, Marquice Uddin Ardelle Lesches  03/19/2021 7:41 AM    [facility]

## 2021-03-19 ENCOUNTER — Ambulatory Visit: Payer: PRIVATE HEALTH INSURANCE | Admitting: Medical

## 2021-03-23 ENCOUNTER — Ambulatory Visit: Payer: PRIVATE HEALTH INSURANCE | Admitting: Cardiovascular Disease

## 2021-04-09 ENCOUNTER — Other Ambulatory Visit: Payer: Self-pay

## 2021-04-09 ENCOUNTER — Telehealth: Payer: Self-pay | Admitting: Cardiology

## 2021-04-09 ENCOUNTER — Ambulatory Visit (INDEPENDENT_AMBULATORY_CARE_PROVIDER_SITE_OTHER): Payer: PRIVATE HEALTH INSURANCE | Admitting: Medical

## 2021-04-09 ENCOUNTER — Encounter: Payer: Self-pay | Admitting: *Deleted

## 2021-04-09 ENCOUNTER — Encounter: Payer: Self-pay | Admitting: Medical

## 2021-04-09 VITALS — BP 140/96 | HR 87 | Ht 66.0 in | Wt 195.4 lb

## 2021-04-09 DIAGNOSIS — I340 Nonrheumatic mitral (valve) insufficiency: Secondary | ICD-10-CM | POA: Diagnosis not present

## 2021-04-09 DIAGNOSIS — I10 Essential (primary) hypertension: Secondary | ICD-10-CM

## 2021-04-09 DIAGNOSIS — Z01812 Encounter for preprocedural laboratory examination: Secondary | ICD-10-CM

## 2021-04-09 DIAGNOSIS — F17200 Nicotine dependence, unspecified, uncomplicated: Secondary | ICD-10-CM

## 2021-04-09 DIAGNOSIS — E782 Mixed hyperlipidemia: Secondary | ICD-10-CM | POA: Diagnosis not present

## 2021-04-09 DIAGNOSIS — Z79899 Other long term (current) drug therapy: Secondary | ICD-10-CM

## 2021-04-09 DIAGNOSIS — IMO0001 Reserved for inherently not codable concepts without codable children: Secondary | ICD-10-CM

## 2021-04-09 MED ORDER — HYDROCHLOROTHIAZIDE 25 MG PO TABS: 25.0000 mg | ORAL_TABLET | Freq: Every day | ORAL | 1 refills | Status: AC

## 2021-04-09 NOTE — Addendum Note (Signed)
Addended by: Marianne Sofia on: 04/09/2021 03:01 PM   Modules accepted: Orders, SmartSet

## 2021-04-09 NOTE — Telephone Encounter (Signed)
Hi this pt has relocated to [facility] and would like to see Dr. Jens Som.   Pt also needs blood work done (bmp & cbc)

## 2021-04-09 NOTE — Patient Instructions (Addendum)
Medication Instructions:  - Your physician has recommended you make the following change in your medication:   1) INCREASE HCTZ (Hydrochlorothiazide) to 25 mg- take 1 tablet by mouth once daily   *If you need a refill on your cardiac medications before your next appointment, please call your pharmacy*   Lab Work: - Your physician recommends that you return for lab work in: 1 week [facility] office) - BMP/ CBC   If you have labs (blood work) drawn today and your tests are completely normal, you will receive your results only by: [employer] (if you have MyChart) OR A paper copy in the mail If you have any lab test that is abnormal or we need to change your treatment, we will call you to review the results.   Testing/Procedures: - Your physician has requested that you have a TEE. During a TEE, sound waves are used to create images of your heart. It provides your doctor with information about the size and shape of your heart and how well your heart's chambers and valves are working. In this test, a transducer is attached to the end of a flexible tube that's guided down your throat and into your esophagus (the tube leading from you mouth to your stomach) to get a more detailed image of your heart. You are not awake for the procedure. Please see the instruction sheet given to you today.    Follow-Up: At [facility], you and your health needs are our priority.  As part of our continuing mission to provide you with exceptional heart care, we have created designated Provider Care Teams.  These Care Teams include your primary Cardiologist (physician) and Advanced Practice Providers (APPs -  Physician Assistants and Nurse Practitioners) who all work together to provide you with the care you need, when you need it.  We recommend signing up for the patient portal called "MyChart".  Sign up information is provided on this After Visit Summary.  MyChart is used to connect with patients for Virtual  Visits (Telemedicine).  Patients are able to view lab/test results, encounter notes, upcoming appointments, etc.  Non-urgent messages can be sent to your provider as well.   To learn more about what you can do with MyChart, go to [email or web address].    Your next appointment:   4-5 week(s)  The format for your next appointment:   In Person  Provider:   In one of the [facility] offices (has moved to [place])   Other Instructions  Transesophageal Echocardiogram Transesophageal echocardiogram (TEE) is a test that uses sound waves to take pictures of your heart. TEE is done by passing a small probe attached to a flexible tube down the part of the body that moves food from your mouth to your stomach (esophagus). The pictures give clear images of your heart. This can help your doctor see if there are problems with your heart. Tell a doctor about: Any allergies you have. All medicines you are taking. This includes vitamins, herbs, eye drops, creams, and over-the-counter medicines. Any problems you or family members have had with anesthetic medicines. Any blood disorders you have. Any surgeries you have had. Any medical conditions you have. Any swallowing problems. Whether you have or have had a blockage in the part of the body that moves food from your mouth to your stomach. Whether you are pregnant or may be pregnant. What are the risks? In general, this is a safe procedure. But, problems may occur, such as: Damage to nearby structures or  organs. A tear in the part of the body that moves food from your mouth to your stomach. Irregular heartbeat. Hoarse voice or trouble swallowing. Bleeding. What happens before the procedure? Medicines Ask your doctor about changing or stopping: Your normal medicines. Vitamins, herbs, and supplements. Over-the-counter medicines. Do not take aspirin or ibuprofen unless you are told to. General instructions Follow instructions from your  doctor about what you cannot eat or drink. You will take out any dentures or dental retainers. Plan to have a responsible adult take you home from the hospital or clinic. Plan to have a responsible adult care for you for the time you are told after you leave the hospital or clinic. This is important. What happens during the procedure?  An IV will be put into one of your veins. You may be given: A sedative. This medicine helps you relax. A medicine to numb the back of your throat. This may be sprayed or gargled. Your blood pressure, heart rate, and breathing will be watched. You may be asked to lie on your left side. A bite block will be placed in your mouth. This keeps you from biting the tube. The tip of the probe will be placed into the back of your mouth. You will be asked to swallow. Your doctor will take pictures of your heart. The probe and bite block will be taken out after the test is done. The procedure may vary among doctors and hospitals. What can I expect after the procedure? You will be monitored until you leave the hospital or clinic. This includes checking your blood pressure, heart rate, breathing rate, and blood oxygen level. Your throat may feel sore and numb. This will get better over time. You will not be allowed to eat or drink until the numbness has gone away. It is common to have a sore throat for a day or two. It is up to you to get the results of your procedure. Ask how to get your results when they are ready. Follow these instructions at home: If you were given a sedative during your procedure, do not drive or use machines until your doctor says that it is safe. Return to your normal activities when your doctor says that it is safe. Keep all follow-up visits. Summary TEE is a test that uses sound waves to take pictures of your heart. You will be given a medicine to help you relax. Do not drive or use machines until your doctor says that it is safe. This  information is not intended to replace advice given to you by your health care provider. Make sure you discuss any questions you have with your health care provider. Document Revised: 02/08/2020 Document Reviewed: 02/08/2020 Elsevier Patient Education  2022 [employer].

## 2021-04-09 NOTE — H&P (View-Only) (Signed)
Cardiology Office Note:    Date:  04/09/2021   ID:  Wanda Ortiz, DOB 06/04/1961, MRN [id number]  PCP:  Metheney, Catherine D, MD  [facility] Cardiologist:  None  [facility] Electrophysiologist:  None   Referring MD: Metheney, Catherine D, *   Chief Complaint: Overdue follow-up  History of Present Illness:    Wanda Ortiz is a 60 y.o. female with a hx of HTN, HLD, tobacco use who presents for follow-up for echo.  Patient was seen in the ED 01/2021 for bronchitis and a murmur was noted and she was referred to cardiology. She saw Dr. Agbor-Etang 02/19/21 and an echo was ordered.   Echo showed LVEF 60-65%, no WMA, mild LVH, G1DD, severe eccentric MR, TEE is recommended  Today, the echo was reviewed in detail. TEE discussed, and she is ok with pursing this. She moved to [place] and would like to move care to [place] or [place]. She denies chest pain, sob, LLE, orthopnea, pnd. She is out of work due to injury to her left foot. She can perform all ADLs, triees to do walking. She is smoking, 1ppd. No history of drug use.    Past Medical History:  Diagnosis Date   AR (allergic rhinitis)    Hyperlipemia    Hypertension    Migraines     Past Surgical History:  Procedure Laterality Date   ABDOMINAL HYSTERECTOMY     partial with BSO for [facility] Tumor    Current Medications: Current Meds  Medication Sig   albuterol (VENTOLIN HFA) 108 (90 Base) MCG/ACT inhaler Inhale 2 puffs into the lungs every 6 (six) hours as needed for wheezing or shortness of breath.   atorvastatin (LIPITOR) 40 MG tablet Take 1 tablet (40 mg total) by mouth daily.   buPROPion (WELLBUTRIN XL) 150 MG 24 hr tablet Take 1 tablet (150 mg total) by mouth daily.   cetirizine-pseudoephedrine (ZYRTEC-D) 5-120 MG tablet Take 1 tablet by mouth daily as needed.   folic acid (FOLVITE) 1 MG tablet Take by mouth daily.    hydrochlorothiazide (HYDRODIURIL) 25 MG tablet Take 1 tablet (25 mg total) by mouth  daily.   methotrexate (RHEUMATREX) 2.5 MG tablet Take 4 tablets by mouth once a week.   naratriptan (AMERGE) 2.5 MG tablet Take 1 tablet (2.5 mg total) by mouth as needed. Take one (1) tablet at onset of headache; if returns or does not resolve, may repeat after 4 hours; do not exceed five (5) mg in 24 hours.   [DISCONTINUED] hydrochlorothiazide (HYDRODIURIL) 12.5 MG tablet Take 1 tablet (12.5 mg total) by mouth daily.     Allergies:   Fish allergy and Shellfish allergy   Social History   Socioeconomic History   Marital status: Single    Spouse name: Not on file   Number of children: Not on file   Years of education: Not on file   Highest education level: Not on file  Occupational History   Not on file  Tobacco Use   Smoking status: Every Day    Packs/day: 0.25    Years: 15.00    Pack years: 3.75    Types: Cigarettes    Last attempt to quit: 11/30/2018    Years since quitting: 2.3   Smokeless tobacco: Never  Vaping Use   Vaping Use: Never used  Substance and Sexual Activity   Alcohol use: Yes    Alcohol/week: 2.0 standard drinks    Types: 2 Standard drinks or equivalent per week     Drug use: No   Sexual activity: Yes  Other Topics Concern   Not on file  Social History Narrative   Not on file   Social Determinants of Health   Financial Resource Strain: Not on file  Food Insecurity: Not on file  Transportation Needs: Not on file  Physical Activity: Not on file  Stress: Not on file  Social Connections: Not on file     Family History: The patient's family history includes Cancer in her father; Diabetes in her mother; Heart disease in her mother; Hyperlipidemia in her mother; Hypertension in her father.  ROS:   Please see the history of present illness.     All other systems reviewed and are negative.  EKGs/Labs/Other Studies Reviewed:    The following studies were reviewed today:  Echo 01/2021   1. Left ventricular ejection fraction, by estimation, is 60 to 65%.  The  left ventricle has normal function. The left ventricle has no regional  wall motion abnormalities. There is mild concentric left ventricular  hypertrophy. Left ventricular diastolic  parameters are consistent with Grade I diastolic dysfunction (impaired  relaxation).   2. Right ventricular systolic function is normal. The right ventricular  size is normal.   3. 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 The mitral valve is myxomatous. Severe mitral valve regurgitation.  No evidence of mitral stenosis.   4. TEE is recommended.   5. The aortic valve is tricuspid. Aortic valve regurgitation is not  visualized. No aortic stenosis is present.   6. There is mild dilatation of the ascending aorta, measuring 36 mm.   7. The inferior vena cava is normal in size with greater than 50%  respiratory variability, suggesting right atrial pressure of 3 mmHg.   EKG:  EKG is not ordered today.   Recent Labs: 12/31/2020: BUN 13; Creatinine, Ser 0.89; Hemoglobin 14.3; Platelets 195; Potassium 3.2; Sodium 144  Recent Lipid Panel    Component Value Date/Time   CHOL 243 (H) 03/05/2021 0801   TRIG 362 (H) 03/05/2021 0801   HDL 41 03/05/2021 0801   CHOLHDL 5.9 03/05/2021 0801   VLDL 72 (H) 03/05/2021 0801   LDLCALC 130 (H) 03/05/2021 0801   LDLDIRECT 93 01/21/2008 1248   Physical Exam:    VS:  BP (!) 140/96 (BP Location: Left Arm, Patient Position: Sitting, Cuff Size: Normal)   Pulse 87   Ht 5' 6" (1.676 m)   Wt 195 lb 6 oz (88.6 kg)   SpO2 98%   BMI 31.53 kg/m     Wt Readings from Last 3 Encounters:  04/09/21 195 lb 6 oz (88.6 kg)  02/19/21 193 lb 2 oz (87.6 kg)  12/31/20 188 lb (85.3 kg)     GEN:  Well nourished, well developed in no acute distress HEENT: Normal NECK: No JVD; No carotid bruits LYMPHATICS: No lymphadenopathy CARDIAC: RRR, + murmur, no rubs, gallops RESPIRATORY:  diffusely diminished ABDOMEN: Soft, non-tender,  non-distended MUSCULOSKELETAL:  No edema; No deformity  SKIN: Warm and dry NEUROLOGIC:  Alert and oriented x 3 PSYCHIATRIC:  Normal affect   ASSESSMENT:    1. Mitral valve insufficiency, unspecified etiology   2. Essential hypertension   3. Hyperlipidemia, mixed   4. Smoking   5. Pre-procedure lab exam   6. Medication management    PLAN:    In order of problems listed above:  Severe MR Echo 01/2021 showed normal LVEF with severe MR. She is asymptomatic with no   signs of volume overload. We will set her up for TEE. She wants to follow in [place] since she lives in [place]. She can be seen back after procedure.   HTN BP a little high today. She takes HCTZ 12.5mg  daily, I will increase to 25mg  daily. BMET in a week.  HLD LDL 130 03/2021. Continue Lipitor  Tobacco use She is still smoking. Cessation advised.   Disposition: Follow up in 3-4 week(s) with MD/APP    Shared Decision Making/Informed Consent   Shared Decision Making/Informed Consent The risks [esophageal damage, perforation (1:10,000 risk), bleeding, pharyngeal hematoma as well as other potential complications associated with conscious sedation including aspiration, arrhythmia, respiratory failure and death], benefits (treatment guidance and diagnostic support) and alternatives of a transesophageal echocardiogram were discussed in detail with Ms. Hirschhorn and she is willing to proceed.    Signed, Antonio Woodhams Ludwig Lean, PA-C  04/09/2021 10:19 AM     [facility]

## 2021-04-09 NOTE — Progress Notes (Addendum)
Cardiology Office Note:    Date:  04/09/2021   ID:  Wanda Ortiz, DOB [birth year], MRN [id number]  PCP:  Agapito Games, MD  [facility] Cardiologist:  None  [facility] Electrophysiologist:  None   Referring MD: Agapito Games, *   Chief Complaint: Overdue follow-up  History of Present Illness:    Wanda Ortiz is a 60 y.o. female with a hx of HTN, HLD, tobacco use who presents for follow-up for echo.  Patient was seen in the ED 01/2021 for bronchitis and a murmur was noted and she was referred to cardiology. She saw Dr. Azucena Cecil 02/19/21 and an echo was ordered.   Echo showed LVEF 60-65%, no WMA, mild LVH, G1DD, severe eccentric MR, TEE is recommended  Today, the echo was reviewed in detail. TEE discussed, and she is ok with pursing this. She moved to [place] and would like to move care to [place] or [place]. She denies chest pain, sob, LLE, orthopnea, pnd. She is out of work due to injury to her left foot. She can perform all ADLs, triees to do walking. She is smoking, 1ppd. No history of drug use.    Past Medical History:  Diagnosis Date   AR (allergic rhinitis)    Hyperlipemia    Hypertension    Migraines     Past Surgical History:  Procedure Laterality Date   ABDOMINAL HYSTERECTOMY     partial with BSO for Brenners Tumor    Current Medications: Current Meds  Medication Sig   albuterol (VENTOLIN HFA) 108 (90 Base) MCG/ACT inhaler Inhale 2 puffs into the lungs every 6 (six) hours as needed for wheezing or shortness of breath.   atorvastatin (LIPITOR) 40 MG tablet Take 1 tablet (40 mg total) by mouth daily.   buPROPion (WELLBUTRIN XL) 150 MG 24 hr tablet Take 1 tablet (150 mg total) by mouth daily.   cetirizine-pseudoephedrine (ZYRTEC-D) 5-120 MG tablet Take 1 tablet by mouth daily as needed.   folic acid (FOLVITE) 1 MG tablet Take by mouth daily.    hydrochlorothiazide (HYDRODIURIL) 25 MG tablet Take 1 tablet (25 mg total) by mouth  daily.   methotrexate (RHEUMATREX) 2.5 MG tablet Take 4 tablets by mouth once a week.   naratriptan (AMERGE) 2.5 MG tablet Take 1 tablet (2.5 mg total) by mouth as needed. Take one (1) tablet at onset of headache; if returns or does not resolve, may repeat after 4 hours; do not exceed five (5) mg in 24 hours.   [DISCONTINUED] hydrochlorothiazide (HYDRODIURIL) 12.5 MG tablet Take 1 tablet (12.5 mg total) by mouth daily.     Allergies:   Fish allergy and Shellfish allergy   Social History   Socioeconomic History   Marital status: Single    Spouse name: Not on file   Number of children: Not on file   Years of education: Not on file   Highest education level: Not on file  Occupational History   Not on file  Tobacco Use   Smoking status: Every Day    Packs/day: 0.25    Years: 15.00    Pack years: 3.75    Types: Cigarettes    Last attempt to quit: 11/30/2018    Years since quitting: 2.3   Smokeless tobacco: Never  Vaping Use   Vaping Use: Never used  Substance and Sexual Activity   Alcohol use: Yes    Alcohol/week: 2.0 standard drinks    Types: 2 Standard drinks or equivalent per week  Drug use: No   Sexual activity: Yes  Other Topics Concern   Not on file  Social History Narrative   Not on file   Social Determinants of Health   Financial Resource Strain: Not on file  Food Insecurity: Not on file  Transportation Needs: Not on file  Physical Activity: Not on file  Stress: Not on file  Social Connections: Not on file     Family History: The patient's family history includes Cancer in her father; Diabetes in her mother; Heart disease in her mother; Hyperlipidemia in her mother; Hypertension in her father.  ROS:   Please see the history of present illness.     All other systems reviewed and are negative.  EKGs/Labs/Other Studies Reviewed:    The following studies were reviewed today:  Echo 01/2021   1. Left ventricular ejection fraction, by estimation, is 60 to 65%.  The  left ventricle has normal function. The left ventricle has no regional  wall motion abnormalities. There is mild concentric left ventricular  hypertrophy. Left ventricular diastolic  parameters are consistent with Grade I diastolic dysfunction (impaired  relaxation).   2. Right ventricular systolic function is normal. The right ventricular  size is normal.   3. 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 The mitral valve is myxomatous. Severe mitral valve regurgitation.  No evidence of mitral stenosis.   4. TEE is recommended.   5. The aortic valve is tricuspid. Aortic valve regurgitation is not  visualized. No aortic stenosis is present.   6. There is mild dilatation of the ascending aorta, measuring 36 mm.   7. The inferior vena cava is normal in size with greater than 50%  respiratory variability, suggesting right atrial pressure of 3 mmHg.   EKG:  EKG is not ordered today.   Recent Labs: 12/31/2020: BUN 13; Creatinine, Ser 0.89; Hemoglobin 14.3; Platelets 195; Potassium 3.2; Sodium 144  Recent Lipid Panel    Component Value Date/Time   CHOL 243 (H) 03/05/2021 0801   TRIG 362 (H) 03/05/2021 0801   HDL 41 03/05/2021 0801   CHOLHDL 5.9 03/05/2021 0801   VLDL 72 (H) 03/05/2021 0801   LDLCALC 130 (H) 03/05/2021 0801   LDLDIRECT 93 01/21/2008 1248   Physical Exam:    VS:  BP (!) 140/96 (BP Location: Left Arm, Patient Position: Sitting, Cuff Size: Normal)   Pulse 87   Ht 5\' 6"  (1.676 m)   Wt 195 lb 6 oz (88.6 kg)   SpO2 98%   BMI 31.53 kg/m     Wt Readings from Last 3 Encounters:  04/09/21 195 lb 6 oz (88.6 kg)  02/19/21 193 lb 2 oz (87.6 kg)  12/31/20 188 lb (85.3 kg)     GEN:  Well nourished, well developed in no acute distress HEENT: Normal NECK: No JVD; No carotid bruits LYMPHATICS: No lymphadenopathy CARDIAC: RRR, + murmur, no rubs, gallops RESPIRATORY:  diffusely diminished ABDOMEN: Soft, non-tender,  non-distended MUSCULOSKELETAL:  No edema; No deformity  SKIN: Warm and dry NEUROLOGIC:  Alert and oriented x 3 PSYCHIATRIC:  Normal affect   ASSESSMENT:    1. Mitral valve insufficiency, unspecified etiology   2. Essential hypertension   3. Hyperlipidemia, mixed   4. Smoking   5. Pre-procedure lab exam   6. Medication management    PLAN:    In order of problems listed above:  Severe MR Echo 01/2021 showed normal LVEF with severe MR. She is asymptomatic with no  signs of volume overload. We will set her up for TEE. She wants to follow in [place] since she lives in [place]. She can be seen back after procedure.   HTN BP a little high today. She takes HCTZ 12.5mg  daily, I will increase to 25mg  daily. BMET in a week.  HLD LDL 130 03/2021. Continue Lipitor  Tobacco use She is still smoking. Cessation advised.   Disposition: Follow up in 3-4 week(s) with MD/APP    Shared Decision Making/Informed Consent   Shared Decision Making/Informed Consent The risks [esophageal damage, perforation (1:10,000 risk), bleeding, pharyngeal hematoma as well as other potential complications associated with conscious sedation including aspiration, arrhythmia, respiratory failure and death], benefits (treatment guidance and diagnostic support) and alternatives of a transesophageal echocardiogram were discussed in detail with Ms. Hirschhorn and she is willing to proceed.    Signed, Shadee Montoya Ludwig Lean, PA-C  04/09/2021 10:19 AM     [facility]

## 2021-04-11 ENCOUNTER — Other Ambulatory Visit: Payer: Self-pay

## 2021-04-11 DIAGNOSIS — I10 Essential (primary) hypertension: Secondary | ICD-10-CM

## 2021-04-11 NOTE — Telephone Encounter (Signed)
Spoke with Patient   She is aware of [facility] Labs next week.  [facility] - 1st floor, [address]/by the elevator: walk-ins welcome  Phone # [phone]  Hours: Monday - Friday, 8am - 4:30p (closed for lunch from 12:30p-1:30p).  Fu scheduled [place]  11/16 at 4

## 2021-04-11 NOTE — Telephone Encounter (Signed)
Orders printed and faxed to [facility] [phone].

## 2021-04-11 NOTE — Progress Notes (Signed)
Patient requested her lab orders be drawn at a [facility] facility. See tele encounter.

## 2021-04-16 ENCOUNTER — Telehealth: Payer: Self-pay | Admitting: Cardiology

## 2021-04-16 DIAGNOSIS — I10 Essential (primary) hypertension: Secondary | ICD-10-CM

## 2021-04-16 DIAGNOSIS — Z01812 Encounter for preprocedural laboratory examination: Secondary | ICD-10-CM

## 2021-04-16 NOTE — Telephone Encounter (Signed)
Patient calling States she went to have labs completed and was told the orders are not released yet States labs should be for [facility] in [place]  When orders altered - please call patient to make aware

## 2021-04-16 NOTE — Telephone Encounter (Signed)
Attempted to place orders again for [facility]. Called facility @ [phone] at [facility] in [place], [place] to confirm orders received. Facility unable to see orders. Lab orders printed and faxed to [phone]. Pt has been made aware.

## 2021-04-18 ENCOUNTER — Encounter: Payer: Self-pay | Admitting: Medical

## 2021-04-18 ENCOUNTER — Encounter (HOSPITAL_COMMUNITY): Payer: Self-pay | Admitting: Internal Medicine

## 2021-04-18 DIAGNOSIS — Z01812 Encounter for preprocedural laboratory examination: Secondary | ICD-10-CM

## 2021-04-18 DIAGNOSIS — I10 Essential (primary) hypertension: Secondary | ICD-10-CM

## 2021-04-19 ENCOUNTER — Telehealth: Payer: Self-pay | Admitting: Medical

## 2021-04-19 MED ORDER — POTASSIUM CHLORIDE CRYS ER 20 MEQ PO TBCR: 40.0000 meq | EXTENDED_RELEASE_TABLET | Freq: Once | ORAL | 0 refills | Status: DC

## 2021-04-19 NOTE — Telephone Encounter (Signed)
I spoke with the patient regarding her lab results and Cadence Furth, PA's recommendations to: 1) Take potassium chloride 20 meq- take 2 tablets (40 meq) x 1 dose  The patient voices understanding and is agreeable. She is aware the tablets are fairly large and can be hard to swallow. She advised that she does fine swallowing tablets.  RX to be sent to the [facility] in [place].

## 2021-04-19 NOTE — Telephone Encounter (Signed)
Cadence David Stall, PA-C  04/19/2021 12:03 PM EDT     Instructions given over chat.

## 2021-04-19 NOTE — Progress Notes (Signed)
Attempted to obtain medical history via telephone, unable to reach at this time. I left a voicemail to return pre surgical testing department's phone call.  

## 2021-04-19 NOTE — Telephone Encounter (Signed)
Jefferey Pica, RN  04/19/2021 12:32 PM EDT     Secure chat message received from [place], [place]- "we can give her KCL once"

## 2021-04-19 NOTE — Telephone Encounter (Signed)
Jefferey Pica, RN  04/18/2021  5:04 PM EDT     Scanned labs, but done for Cadence Furth, [place]- pre TEE scheduled for 04/27/21.    K+ 3.4.    Preliminary results reviewed. Forwarded to MD desktop for review and signature.

## 2021-04-27 ENCOUNTER — Other Ambulatory Visit: Payer: Self-pay

## 2021-04-27 ENCOUNTER — Encounter (HOSPITAL_COMMUNITY)
Admission: RE | Disposition: A | Discharge status: Home / Self Care | Payer: Self-pay | Source: Home / Self Care | Attending: Internal Medicine

## 2021-04-27 ENCOUNTER — Encounter (HOSPITAL_COMMUNITY): Payer: Self-pay | Admitting: Internal Medicine

## 2021-04-27 ENCOUNTER — Ambulatory Visit (HOSPITAL_COMMUNITY): Payer: PRIVATE HEALTH INSURANCE | Admitting: Anesthesiology

## 2021-04-27 ENCOUNTER — Ambulatory Visit (HOSPITAL_BASED_OUTPATIENT_CLINIC_OR_DEPARTMENT_OTHER)
Admission: RE | Admit: 2021-04-27 | Discharge: 2021-04-27 | Disposition: A | Discharge status: Home / Self Care | Payer: PRIVATE HEALTH INSURANCE | Source: Ambulatory Visit | Attending: Medical | Admitting: Medical

## 2021-04-27 ENCOUNTER — Ambulatory Visit (HOSPITAL_COMMUNITY)
Admission: RE | Admit: 2021-04-27 | Discharge: 2021-04-27 | Disposition: A | Discharge status: Home / Self Care | Payer: PRIVATE HEALTH INSURANCE | Attending: Internal Medicine | Admitting: Internal Medicine

## 2021-04-27 DIAGNOSIS — I10 Essential (primary) hypertension: Secondary | ICD-10-CM | POA: Diagnosis not present

## 2021-04-27 DIAGNOSIS — Z79899 Other long term (current) drug therapy: Secondary | ICD-10-CM | POA: Insufficient documentation

## 2021-04-27 DIAGNOSIS — E782 Mixed hyperlipidemia: Secondary | ICD-10-CM | POA: Diagnosis not present

## 2021-04-27 DIAGNOSIS — Z91013 Allergy to seafood: Secondary | ICD-10-CM | POA: Diagnosis not present

## 2021-04-27 DIAGNOSIS — Z8249 Family history of ischemic heart disease and other diseases of the circulatory system: Secondary | ICD-10-CM | POA: Insufficient documentation

## 2021-04-27 DIAGNOSIS — I340 Nonrheumatic mitral (valve) insufficiency: Secondary | ICD-10-CM | POA: Diagnosis not present

## 2021-04-27 DIAGNOSIS — F17210 Nicotine dependence, cigarettes, uncomplicated: Secondary | ICD-10-CM | POA: Insufficient documentation

## 2021-04-27 HISTORY — PX: TEE WITHOUT CARDIOVERSION: SHX5443

## 2021-04-27 HISTORY — PX: BUBBLE STUDY: SHX6837

## 2021-04-27 SURGERY — ECHOCARDIOGRAM, TRANSESOPHAGEAL
Anesthesia: Monitor Anesthesia Care

## 2021-04-27 MED ORDER — LIDOCAINE HCL (CARDIAC) PF 100 MG/5ML IV SOSY
PREFILLED_SYRINGE | INTRAVENOUS | Status: DC | PRN
  Administered 2021-04-27: 100 mg via INTRATRACHEAL

## 2021-04-27 MED ORDER — SODIUM CHLORIDE 0.9 % IV SOLN: INTRAVENOUS | Status: DC

## 2021-04-27 MED ORDER — SODIUM CHLORIDE 0.9 % IV SOLN
INTRAVENOUS | Status: AC | PRN
  Administered 2021-04-27: 500 mL via INTRAVENOUS

## 2021-04-27 MED ORDER — PROPOFOL 10 MG/ML IV BOLUS
INTRAVENOUS | Status: DC | PRN
  Administered 2021-04-27: 30 mg via INTRAVENOUS
  Administered 2021-04-27: 10 mg via INTRAVENOUS
  Administered 2021-04-27: 20 mg via INTRAVENOUS

## 2021-04-27 MED ORDER — PROPOFOL 500 MG/50ML IV EMUL
INTRAVENOUS | Status: DC | PRN
  Administered 2021-04-27: 100 ug/kg/min via INTRAVENOUS

## 2021-04-27 MED ORDER — BUTAMBEN-TETRACAINE-BENZOCAINE 2-2-14 % EX AERO
INHALATION_SPRAY | CUTANEOUS | Status: DC | PRN
  Administered 2021-04-27: 1 via TOPICAL

## 2021-04-27 NOTE — Discharge Instructions (Signed)

## 2021-04-27 NOTE — CV Procedure (Signed)
INDICATIONS: Severe MR  PROCEDURE:   Informed consent was obtained prior to the procedure. The risks, benefits and alternatives for the procedure were discussed and the patient comprehended these risks.  Risks include, but are not limited to, cough, sore throat, vomiting, nausea, somnolence, esophageal and stomach trauma or perforation, bleeding, low blood pressure, aspiration, pneumonia, infection, trauma to the teeth and death.    After a procedural time-out, the oropharynx was anesthetized with 20% benzocaine spray.   During this procedure the patient was administered propofol per anesthesia.  The patient's heart rate, blood pressure, and oxygen saturation were monitored continuously during the procedure. The period of conscious sedation was 30 minutes, of which I was present face-to-face 100% of this time.  The transesophageal probe was inserted in the esophagus and stomach without difficulty and multiple views were obtained.  The patient was kept under observation until the patient left the procedure room.  The patient left the procedure room in stable condition.   Agitated microbubble saline contrast was administered.  COMPLICATIONS:    There were no immediate complications.  FINDINGS:   FORMAL ECHOCARDIOGRAM REPORT PENDING EF 65-70% Severe mitral valve regurgitation. Eccentric, anteriorly directed MR, with flail of the P2 scallop of the mitral valve and P2 prolapse. Valve is otherwise not significantly myxomatous. There is systolic blunting of the P veins. Not  Trivial-mild TR Normal AV and PV No PFO  RECOMMENDATIONS:    Recommend evaluation for mitral vale repair.   Time Spent Directly with the Patient:  60 minutes   Parke Poisson 04/27/2021, 9:33 AM

## 2021-04-27 NOTE — Transfer of Care (Signed)
Immediate Anesthesia Transfer of Care Note  Patient: Wanda Ortiz  Procedure(s) Performed: TRANSESOPHAGEAL ECHOCARDIOGRAM (TEE) BUBBLE STUDY  Patient Location: Endoscopy Unit  Anesthesia Type:MAC  Level of Consciousness: drowsy and patient cooperative  Airway & Oxygen Therapy: Patient Spontanous Breathing and Patient connected to nasal cannula oxygen  Post-op Assessment: Report given to RN and Post -op Vital signs reviewed and stable  Post vital signs: Reviewed and stable  Last Vitals:  Vitals Value Taken Time  BP 126/75 04/27/21 0924  Temp    Pulse 77 04/27/21 0924  Resp 17 04/27/21 0924  SpO2 97 % 04/27/21 0924  Vitals shown include unvalidated device data.  Last Pain:  Vitals:   04/27/21 0805  TempSrc: Oral         Complications: No notable events documented.

## 2021-04-27 NOTE — Progress Notes (Signed)
Echocardiogram Echocardiogram Transesophageal has been performed.  Warren Lacy Chauntel Windsor RDCS 04/27/2021, 9:44 AM

## 2021-04-27 NOTE — Anesthesia Preprocedure Evaluation (Signed)
Anesthesia Evaluation  Patient identified by MRN, date of birth, ID band Patient awake    Reviewed: Allergy & Precautions, H&P , NPO status , Patient's Chart, lab work & pertinent test results  Airway Mallampati: II   Neck ROM: full    Dental   Pulmonary Current Smoker,    breath sounds clear to auscultation       Cardiovascular hypertension, + Valvular Problems/Murmurs MR  Rhythm:regular Rate:Normal     Neuro/Psych  Headaches,  Neuromuscular disease    GI/Hepatic   Endo/Other    Renal/GU      Musculoskeletal   Abdominal   Peds  Hematology   Anesthesia Other Findings   Reproductive/Obstetrics                             Anesthesia Physical Anesthesia Plan  ASA: 2  Anesthesia Plan: MAC   Post-op Pain Management:    Induction: Intravenous  PONV Risk Score and Plan: 1 and Propofol infusion and Treatment may vary due to age or medical condition  Airway Management Planned: Nasal Cannula  Additional Equipment:   Intra-op Plan:   Post-operative Plan:   Informed Consent: I have reviewed the patients History and Physical, chart, labs and discussed the procedure including the risks, benefits and alternatives for the proposed anesthesia with the patient or authorized representative who has indicated his/her understanding and acceptance.     Dental advisory given  Plan Discussed with: CRNA, Anesthesiologist and Surgeon  Anesthesia Plan Comments:         Anesthesia Quick Evaluation

## 2021-04-27 NOTE — Interval H&P Note (Signed)
History and Physical Interval Note:  04/27/2021 8:46 AM  Wanda Ortiz  has presented today for surgery, with the diagnosis of SEVERE MITRAL VALVE REGURGITATION.  The various methods of treatment have been discussed with the patient and family. After consideration of risks, benefits and other options for treatment, the patient has consented to  Procedure(s): TRANSESOPHAGEAL ECHOCARDIOGRAM (TEE) (N/A) as a surgical intervention.  The patient's history has been reviewed, patient examined, no change in status, stable for surgery.  I have reviewed the patient's chart and labs.  Questions were answered to the patient's satisfaction.     Parke Poisson

## 2021-04-28 NOTE — Anesthesia Postprocedure Evaluation (Signed)
Anesthesia Post Note  Patient: Wanda Ortiz  Procedure(s) Performed: TRANSESOPHAGEAL ECHOCARDIOGRAM (TEE) BUBBLE STUDY     Patient location during evaluation: PACU Anesthesia Type: MAC Level of consciousness: awake and alert Pain management: pain level controlled Vital Signs Assessment: post-procedure vital signs reviewed and stable Respiratory status: spontaneous breathing, nonlabored ventilation, respiratory function stable and patient connected to nasal cannula oxygen Cardiovascular status: stable and blood pressure returned to baseline Postop Assessment: no apparent nausea or vomiting Anesthetic complications: no   No notable events documented.  Last Vitals:  Vitals:   04/27/21 0935 04/27/21 0945  BP: 119/68 115/84  Pulse: 81 72  Resp: 15 14  Temp:    SpO2: 97% 100%    Last Pain:  Vitals:   04/27/21 0945  TempSrc:   PainSc: 0-No pain                 [name]

## 2021-04-29 ENCOUNTER — Other Ambulatory Visit: Payer: Self-pay | Admitting: Family Medicine

## 2021-04-29 DIAGNOSIS — I10 Essential (primary) hypertension: Secondary | ICD-10-CM

## 2021-04-30 ENCOUNTER — Encounter (HOSPITAL_COMMUNITY): Payer: Self-pay | Admitting: Internal Medicine

## 2021-05-02 LAB — ECHO TEE: MV VTI: 1.85 cm2

## 2021-05-04 ENCOUNTER — Telehealth: Payer: Self-pay

## 2021-05-04 DIAGNOSIS — R931 Abnormal findings on diagnostic imaging of heart and coronary circulation: Secondary | ICD-10-CM

## 2021-05-04 DIAGNOSIS — I340 Nonrheumatic mitral (valve) insufficiency: Secondary | ICD-10-CM

## 2021-05-04 NOTE — Telephone Encounter (Signed)
Left detail message on VM of pt's recent results okay by DPR, Dr. Mariah Milling advised   "Echo showed LVEF 60-65%, severe mitral valve regurgitation. Needs to see structural heart team in [place]. "  At this time, no further recommendations or medications changes, will place referral for the structural hear team, advised to call office for any concerns or questions, otherwise will see at next visit  in [place] and await call from [place].

## 2021-05-09 NOTE — Progress Notes (Deleted)
HPI: Follow-up mitral regurgitation.  Previously followed by Dr. Garen Lah and now transferring care to me.  My first time evaluating patient.  Recently seen for murmur.  Echocardiogram August 2022 showed normal LV function, mild left ventricular hypertrophy, grade 1 diastolic dysfunction, severe anteriorly directed mitral regurgitation.  Transesophageal echocardiogram October 2022 showed normal LV function, mild to moderate left atrial enlargement, flail P2 segment of the mitral valve with severe anteriorly directed mitral regurgitation and trace aortic insufficiency.  Since last seen  Current Outpatient Medications  Medication Sig Dispense Refill   albuterol (VENTOLIN HFA) 108 (90 Base) MCG/ACT inhaler Inhale 2 puffs into the lungs every 6 (six) hours as needed for wheezing or shortness of breath. 180 g 0   atorvastatin (LIPITOR) 40 MG tablet Take 1 tablet (40 mg total) by mouth daily. 30 tablet 5   buPROPion (WELLBUTRIN XL) 150 MG 24 hr tablet Take 1 tablet (150 mg total) by mouth daily. 30 tablet 1   cetirizine-pseudoephedrine (ZYRTEC-D) 5-120 MG tablet Take 1 tablet by mouth daily as needed. 30 tablet 0   folic acid (FOLVITE) 1 MG tablet Take 3 mg by mouth daily.     hydrochlorothiazide (HYDRODIURIL) 25 MG tablet Take 1 tablet (25 mg total) by mouth daily. 90 tablet 1   methotrexate (RHEUMATREX) 2.5 MG tablet Take 10 mg by mouth every Thursday. 4 tabs     naratriptan (AMERGE) 2.5 MG tablet Take 1 tablet (2.5 mg total) by mouth as needed. Take one (1) tablet at onset of headache; if returns or does not resolve, may repeat after 4 hours; do not exceed five (5) mg in 24 hours. 10 tablet PRN   No current facility-administered medications for this visit.     Past Medical History:  Diagnosis Date   AR (allergic rhinitis)    Hyperlipemia    Hypertension    Migraines     Past Surgical History:  Procedure Laterality Date   ABDOMINAL HYSTERECTOMY     partial with BSO for [facility]  Tumor   BUBBLE STUDY  04/27/2021   Procedure: BUBBLE STUDY;  Surgeon: Elouise Munroe, MD;  Location: [facility];  Service: Cardiology;;   TEE WITHOUT CARDIOVERSION N/A 04/27/2021   Procedure: TRANSESOPHAGEAL ECHOCARDIOGRAM (TEE);  Surgeon: Elouise Munroe, MD;  Location: [facility];  Service: Cardiology;  Laterality: N/A;    Social History   Socioeconomic History   Marital status: Single    Spouse name: Not on file   Number of children: Not on file   Years of education: Not on file   Highest education level: Not on file  Occupational History   Not on file  Tobacco Use   Smoking status: Every Day    Packs/day: 0.25    Years: 15.00    Pack years: 3.75    Types: Cigarettes    Last attempt to quit: 11/30/2018    Years since quitting: 2.4   Smokeless tobacco: Never  Vaping Use   Vaping Use: Never used  Substance and Sexual Activity   Alcohol use: Yes    Alcohol/week: 2.0 standard drinks    Types: 2 Standard drinks or equivalent per week   Drug use: No   Sexual activity: Yes  Other Topics Concern   Not on file  Social History Narrative   Not on file   Social Determinants of Health   Financial Resource Strain: Not on file  Food Insecurity: Not on file  Transportation Needs: Not on file  Physical  Activity: Not on file  Stress: Not on file  Social Connections: Not on file  Intimate Partner Violence: Not on file    Family History  Problem Relation Age of Onset   Heart disease Mother    Diabetes Mother    Hyperlipidemia Mother    Hypertension Father    Cancer Father        prostate    ROS: no fevers or chills, productive cough, hemoptysis, dysphasia, odynophagia, melena, hematochezia, dysuria, hematuria, rash, seizure activity, orthopnea, PND, pedal edema, claudication. Remaining systems are negative.  Physical Exam: Well-developed well-nourished in no acute distress.  Skin is warm and dry.  HEENT is normal.  Neck is supple.  Chest is clear to  auscultation with normal expansion.  Cardiovascular exam is regular rate and rhythm.  Abdominal exam nontender or distended. No masses palpated. Extremities show no edema. neuro grossly intact  ECG- personally reviewed  A/P  1 mitral regurgitation-transesophageal echocardiogram was personally reviewed.  Patient has flail P2 segment with severe anteriorly directed mitral regurgitation.  2 hypertension-  3 hyperlipidemia-  4 tobacco abuse-patient counseled on discontinuing.  Olga Millers, MD

## 2021-05-09 NOTE — H&P (View-Only) (Signed)
Patient ID: Wanda Ortiz MRN: [id number] DOB/AGE: [birth year] 60 y.o.  Primary Care Physician:Metheney, Barbarann Ehlers, MD Primary Cardiologist: Debbe Odea, MD   PROBLEM LIST: 1.  Severe degenerative mitral regurgitation with P2 flail and prolapse; normal ejection fraction 2.  Rheumatoid arthritis on weekly methotrexate 3.  Hyperlipidemia 4.  Hypertension   HISTORY OF PRESENT ILLNESS: The patient is a 60 y.o. year old female with the indicated medical issues referred by Dr. Myriam Forehand for recommendations regarding the patient's mitral valvular disease.  The patient tells me that in August she had an episode of a rather severe cough with dyspnea and that was billed as bronchitis.  There was abrupt onset.  Prior to this she was doing everything physically that she needed to do.  However at the end of June she dropped a heavy item on her left ankle and she has not been able to do as much physically--she had been quite active without any limitations.  She is back to work now but working in a rather sedentary position versus what she was doing prior to this episode.  She does note that she will fatigue more easily than last year and does get short of breath at times.  She denies any exertional angina.  She has had no palpitations, presyncope, syncope, paroxysmal nocturnal dyspnea, orthopnea.  She has some intermittent left lower extremity swelling associated with her injury.  Fortunately she has not required any hospitalizations or emergency room visits.   Past Medical History:  Diagnosis Date   AR (allergic rhinitis)    Hyperlipemia    Hypertension    Migraines     Past Surgical History:  Procedure Laterality Date   ABDOMINAL HYSTERECTOMY     partial with BSO for [facility]   BUBBLE STUDY  04/27/2021   Procedure: BUBBLE STUDY;  Surgeon: Parke Poisson, MD;  Location: [facility] ENDOSCOPY;  Service: Cardiology;;   TEE WITHOUT CARDIOVERSION N/A 04/27/2021   Procedure:  TRANSESOPHAGEAL ECHOCARDIOGRAM (TEE);  Surgeon: Parke Poisson, MD;  Location: [facility] ENDOSCOPY;  Service: Cardiology;  Laterality: N/A;    Family History  Problem Relation Age of Onset   Heart disease Mother    Diabetes Mother    Hyperlipidemia Mother    Hypertension Father    Cancer Father        prostate    Social History   Socioeconomic History   Marital status: Single    Spouse name: Not on file   Number of children: Not on file   Years of education: Not on file   Highest education level: Not on file  Occupational History   Not on file  Tobacco Use   Smoking status: Every Day    Packs/day: 0.25    Years: 15.00    Pack years: 3.75    Types: Cigarettes    Last attempt to quit: 11/30/2018    Years since quitting: 2.4   Smokeless tobacco: Never  Vaping Use   Vaping Use: Never used  Substance and Sexual Activity   Alcohol use: Yes    Alcohol/week: 2.0 standard drinks    Types: 2 Standard drinks or equivalent per week   Drug use: No   Sexual activity: Yes  Other Topics Concern   Not on file  Social History Narrative   Not on file   Social Determinants of Health   Financial Resource Strain: Not on file  Food Insecurity: Not on file  Transportation Needs: Not on file  Physical Activity:  Not on file  Stress: Not on file  Social Connections: Not on file  Intimate Partner Violence: Not on file     Prior to Admission medications   Medication Sig Start Date End Date Taking? Authorizing Provider  albuterol (VENTOLIN HFA) 108 (90 Base) MCG/ACT inhaler Inhale 2 puffs into the lungs every 6 (six) hours as needed for wheezing or shortness of breath. 10/02/20   Hali Marry, MD  atorvastatin (LIPITOR) 40 MG tablet Take 1 tablet (40 mg total) by mouth daily. 03/09/21 06/07/21  Kate Sable, MD  buPROPion (WELLBUTRIN XL) 150 MG 24 hr tablet Take 1 tablet (150 mg total) by mouth daily. 09/19/20   Hali Marry, MD  cetirizine-pseudoephedrine (ZYRTEC-D) 5-120  MG tablet Take 1 tablet by mouth daily as needed. 02/02/21   Terrilyn Saver, NP  folic acid (FOLVITE) 1 MG tablet Take 3 mg by mouth daily.    Feliz Beam, MD  hydrochlorothiazide (HYDRODIURIL) 25 MG tablet Take 1 tablet (25 mg total) by mouth daily. 04/09/21 07/08/21  Furth, Cadence H, PA-C  methotrexate (RHEUMATREX) 2.5 MG tablet Take 10 mg by mouth every Thursday. 4 tabs 11/16/18   [provider]  naratriptan (AMERGE) 2.5 MG tablet Take 1 tablet (2.5 mg total) by mouth as needed. Take one (1) tablet at onset of headache; if returns or does not resolve, may repeat after 4 hours; do not exceed five (5) mg in 24 hours. 11/20/18   Hali Marry, MD    Allergies  Allergen Reactions   Fish Allergy Hives   Shellfish Allergy Hives    REVIEW OF SYSTEMS:  General: no fevers/chills/night sweats Eyes: no blurry vision, diplopia, or amaurosis ENT: no sore throat or hearing loss Resp: no cough, wheezing, or hemoptysis CV: no edema or palpitations GI: no abdominal pain, nausea, vomiting, diarrhea, or constipation GU: no dysuria, frequency, or hematuria Skin: no rash Neuro: no headache, numbness, tingling, or weakness of extremities Musculoskeletal: no joint pain or swelling Heme: no bleeding, DVT, or easy bruising Endo: no polydipsia or polyuria  BP (!) 134/92   Pulse (!) 105   Ht 5\' 6"  (1.676 m)   Wt 196 lb (88.9 kg)   SpO2 97%   BMI 31.64 kg/m   PHYSICAL EXAM: GEN:  AO x 3 in no acute distress HEENT: normal Dentition: Normal Neck: JVP normal. +2 carotid upstrokes without bruits. No thyromegaly. Lungs: equal expansion, clear bilaterally CV: Apex is discrete and nondisplaced, RRR with 4/6 holosystolic murmur Abd: soft, non-tender, non-distended; no bruit; positive bowel sounds Ext: no edema, ecchymoses, or cyanosis Vascular: 2+ femoral pulses, 2+ radial pulses       Skin: warm and dry without rash Neuro: CN II-XII grossly intact; motor and sensory grossly  intact    DATA AND STUDIES:  EKG: Normal sinus rhythm  ECHO: I reviewed her TEE images which demonstrate flail of P2 scallop as well as P2 prolapse.  There was blunting of systolic pulmonary vein flow.  The ejection fraction was 60 to 65% and right ventricular function was normal.  CARDIAC CATH: Not yet performed  STS RISK CALCULATOR: 1.4%  NHYA CLASS: 2-3  Labs from October demonstrate sodium 147, potassium 3.4, BUN 14, creatinine 0.9, white blood cell count 8, hematocrit 47.1  ASSESSMENT AND PLAN:   Nonrheumatic mitral valve regurgitation - Plan: EKG XX123456, Basic metabolic panel, CBC, Ambulatory referral to Cardiothoracic Surgery The patient has severe mitral regurgitation with likely abrupt chordal rupture in August.  She is  symptomatic even though she is not exerting herself to her normal degree due to her left ankle injury.  I discussed with her that given her lack of comorbidities and rather young age, she is best served by a surgical mitral valve repair.  I will refer her for coronary angiography, left heart catheterization, and right heart catheterization study.  I will refer her to Dr. Durward Mallard at [facility] for further recommendations.  She agrees with this plan and has no further questions.  I have personally reviewed the patients imaging data as summarized above.  I have reviewed the natural history of severe mitral regurgitation with the patient and family members who are present today. We have discussed the limitations of medical therapy and the poor prognosis associated with severe mitral regurgitation. We have also reviewed potential treatment options.   All of the patient's questions were answered today. Will make further recommendations based on the results of studies outlined above.   Early Osmond, MD  05/11/2021 7:05 AM    [address], [place], [place]  [address] Phone: [phone]; Fax: [phone]

## 2021-05-09 NOTE — Progress Notes (Signed)
Patient ID: Wanda Ortiz MRN: [id number] DOB/AGE: [birth year] 60 y.o.  Primary Care Physician:Metheney, Barbarann Ehlers, MD Primary Cardiologist: Debbe Odea, MD   PROBLEM LIST: 1.  Severe degenerative mitral regurgitation with P2 flail and prolapse; normal ejection fraction 2.  Rheumatoid arthritis on weekly methotrexate 3.  Hyperlipidemia 4.  Hypertension   HISTORY OF PRESENT ILLNESS: The patient is a 60 y.o. year old female with the indicated medical issues referred by Dr. Myriam Forehand for recommendations regarding the patient's mitral valvular disease.  The patient tells me that in August she had an episode of a rather severe cough with dyspnea and that was billed as bronchitis.  There was abrupt onset.  Prior to this she was doing everything physically that she needed to do.  However at the end of June she dropped a heavy item on her left ankle and she has not been able to do as much physically--she had been quite active without any limitations.  She is back to work now but working in a rather sedentary position versus what she was doing prior to this episode.  She does note that she will fatigue more easily than last year and does get short of breath at times.  She denies any exertional angina.  She has had no palpitations, presyncope, syncope, paroxysmal nocturnal dyspnea, orthopnea.  She has some intermittent left lower extremity swelling associated with her injury.  Fortunately she has not required any hospitalizations or emergency room visits.   Past Medical History:  Diagnosis Date   AR (allergic rhinitis)    Hyperlipemia    Hypertension    Migraines     Past Surgical History:  Procedure Laterality Date   ABDOMINAL HYSTERECTOMY     partial with BSO for [facility]   BUBBLE STUDY  04/27/2021   Procedure: BUBBLE STUDY;  Surgeon: Parke Poisson, MD;  Location: [facility] ENDOSCOPY;  Service: Cardiology;;   TEE WITHOUT CARDIOVERSION N/A 04/27/2021   Procedure:  TRANSESOPHAGEAL ECHOCARDIOGRAM (TEE);  Surgeon: Parke Poisson, MD;  Location: [facility] ENDOSCOPY;  Service: Cardiology;  Laterality: N/A;    Family History  Problem Relation Age of Onset   Heart disease Mother    Diabetes Mother    Hyperlipidemia Mother    Hypertension Father    Cancer Father        prostate    Social History   Socioeconomic History   Marital status: Single    Spouse name: Not on file   Number of children: Not on file   Years of education: Not on file   Highest education level: Not on file  Occupational History   Not on file  Tobacco Use   Smoking status: Every Day    Packs/day: 0.25    Years: 15.00    Pack years: 3.75    Types: Cigarettes    Last attempt to quit: 11/30/2018    Years since quitting: 2.4   Smokeless tobacco: Never  Vaping Use   Vaping Use: Never used  Substance and Sexual Activity   Alcohol use: Yes    Alcohol/week: 2.0 standard drinks    Types: 2 Standard drinks or equivalent per week   Drug use: No   Sexual activity: Yes  Other Topics Concern   Not on file  Social History Narrative   Not on file   Social Determinants of Health   Financial Resource Strain: Not on file  Food Insecurity: Not on file  Transportation Needs: Not on file  Physical Activity:  Not on file  Stress: Not on file  Social Connections: Not on file  Intimate Partner Violence: Not on file     Prior to Admission medications   Medication Sig Start Date End Date Taking? Authorizing Provider  albuterol (VENTOLIN HFA) 108 (90 Base) MCG/ACT inhaler Inhale 2 puffs into the lungs every 6 (six) hours as needed for wheezing or shortness of breath. 10/02/20   Hali Marry, MD  atorvastatin (LIPITOR) 40 MG tablet Take 1 tablet (40 mg total) by mouth daily. 03/09/21 06/07/21  Kate Sable, MD  buPROPion (WELLBUTRIN XL) 150 MG 24 hr tablet Take 1 tablet (150 mg total) by mouth daily. 09/19/20   Hali Marry, MD  cetirizine-pseudoephedrine (ZYRTEC-D) 5-120  MG tablet Take 1 tablet by mouth daily as needed. 02/02/21   Terrilyn Saver, NP  folic acid (FOLVITE) 1 MG tablet Take 3 mg by mouth daily.    Feliz Beam, MD  hydrochlorothiazide (HYDRODIURIL) 25 MG tablet Take 1 tablet (25 mg total) by mouth daily. 04/09/21 07/08/21  Furth, Cadence H, PA-C  methotrexate (RHEUMATREX) 2.5 MG tablet Take 10 mg by mouth every Thursday. 4 tabs 11/16/18   [provider]  naratriptan (AMERGE) 2.5 MG tablet Take 1 tablet (2.5 mg total) by mouth as needed. Take one (1) tablet at onset of headache; if returns or does not resolve, may repeat after 4 hours; do not exceed five (5) mg in 24 hours. 11/20/18   Hali Marry, MD    Allergies  Allergen Reactions   Fish Allergy Hives   Shellfish Allergy Hives    REVIEW OF SYSTEMS:  General: no fevers/chills/night sweats Eyes: no blurry vision, diplopia, or amaurosis ENT: no sore throat or hearing loss Resp: no cough, wheezing, or hemoptysis CV: no edema or palpitations GI: no abdominal pain, nausea, vomiting, diarrhea, or constipation GU: no dysuria, frequency, or hematuria Skin: no rash Neuro: no headache, numbness, tingling, or weakness of extremities Musculoskeletal: no joint pain or swelling Heme: no bleeding, DVT, or easy bruising Endo: no polydipsia or polyuria  BP (!) 134/92   Pulse (!) 105   Ht 5\' 6"  (1.676 m)   Wt 196 lb (88.9 kg)   SpO2 97%   BMI 31.64 kg/m   PHYSICAL EXAM: GEN:  AO x 3 in no acute distress HEENT: normal Dentition: Normal Neck: JVP normal. +2 carotid upstrokes without bruits. No thyromegaly. Lungs: equal expansion, clear bilaterally CV: Apex is discrete and nondisplaced, RRR with 4/6 holosystolic murmur Abd: soft, non-tender, non-distended; no bruit; positive bowel sounds Ext: no edema, ecchymoses, or cyanosis Vascular: 2+ femoral pulses, 2+ radial pulses       Skin: warm and dry without rash Neuro: CN II-XII grossly intact; motor and sensory grossly  intact    DATA AND STUDIES:  EKG: Normal sinus rhythm  ECHO: I reviewed her TEE images which demonstrate flail of P2 scallop as well as P2 prolapse.  There was blunting of systolic pulmonary vein flow.  The ejection fraction was 60 to 65% and right ventricular function was normal.  CARDIAC CATH: Not yet performed  STS RISK CALCULATOR: 1.4%  NHYA CLASS: 2-3  Labs from October demonstrate sodium 147, potassium 3.4, BUN 14, creatinine 0.9, white blood cell count 8, hematocrit 47.1  ASSESSMENT AND PLAN:   Nonrheumatic mitral valve regurgitation - Plan: EKG XX123456, Basic metabolic panel, CBC, Ambulatory referral to Cardiothoracic Surgery The patient has severe mitral regurgitation with likely abrupt chordal rupture in August.  She is  symptomatic even though she is not exerting herself to her normal degree due to her left ankle injury.  I discussed with her that given her lack of comorbidities and rather young age, she is best served by a surgical mitral valve repair.  I will refer her for coronary angiography, left heart catheterization, and right heart catheterization study.  I will refer her to Dr. Durward Mallard at [facility] for further recommendations.  She agrees with this plan and has no further questions.  I have personally reviewed the patients imaging data as summarized above.  I have reviewed the natural history of severe mitral regurgitation with the patient and family members who are present today. We have discussed the limitations of medical therapy and the poor prognosis associated with severe mitral regurgitation. We have also reviewed potential treatment options.   All of the patient's questions were answered today. Will make further recommendations based on the results of studies outlined above.   Early Osmond, MD  05/11/2021 7:05 AM    [address], [place], [place]  [address] Phone: [phone]; Fax: [phone]

## 2021-05-10 ENCOUNTER — Ambulatory Visit (INDEPENDENT_AMBULATORY_CARE_PROVIDER_SITE_OTHER): Payer: PRIVATE HEALTH INSURANCE | Admitting: Internal Medicine

## 2021-05-10 ENCOUNTER — Encounter: Payer: Self-pay | Admitting: Internal Medicine

## 2021-05-10 ENCOUNTER — Telehealth: Payer: Self-pay | Admitting: *Deleted

## 2021-05-10 ENCOUNTER — Other Ambulatory Visit: Payer: Self-pay

## 2021-05-10 VITALS — BP 134/92 | HR 105 | Ht 66.0 in | Wt 196.0 lb

## 2021-05-10 DIAGNOSIS — I340 Nonrheumatic mitral (valve) insufficiency: Secondary | ICD-10-CM

## 2021-05-10 NOTE — Telephone Encounter (Signed)
Reviewed the patient's chart. She is scheduled to see Dr. Lynnette Caffey on 05/10/21 for a consultation for MitraClip.

## 2021-05-10 NOTE — Telephone Encounter (Signed)
-----   Message from Iona Coach, RN sent at 05/07/2021 12:59 PM EST ----- Hey,  I have left this pt a message to contact me for appointment.  Thanks, Lauren  ----- Message ----- From: Jefferey Pica, RN Sent: 05/07/2021  11:12 AM EST To: Iona Coach, RN, Jefferey Pica, RN, #  Hey friends!  Cadence Fransico Michael, [place] set this lady up for a recent TEE that showed severe MR and she wanted to refer to structural heart.  Who schedules for you guys.  One of the other nurses put a referral in, but just wanted to follow up.  She has moved from [place] to [place] and is scheduled to see Dr. Jens Som out there on 11/16.  Thanks!

## 2021-05-10 NOTE — Progress Notes (Signed)
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 However, given the image below, pt could benefit from a surgical option if available. The fossa looks approachable for transseptal puncture in the Bicaval view. LA dimensions are large enough for device steering and straddle. The posterior leaflet measures 1.66 cm in the 116 LVOT grasping view. Mean gradient measured at & MVA of 5.78cm2 via planimetry.        Note-appears that posterior papillary head & chord have ruptured in this image

## 2021-05-10 NOTE — Patient Instructions (Addendum)
Medication Instructions:  No changes *If you need a refill on your cardiac medications before your next appointment, please call your pharmacy*   Lab Work: Today: BMET, CBC  Testing/Procedures: Your physician has requested that you have a cardiac catheterization. Cardiac catheterization is used to diagnose and/or treat various heart conditions. Doctors may recommend this procedure for a number of different reasons. The most common reason is to evaluate chest pain. Chest pain can be a symptom of coronary artery disease (CAD), and cardiac catheterization can show whether plaque is narrowing or blocking your heart's arteries. This procedure is also used to evaluate the valves, as well as measure the blood flow and oxygen levels in different parts of your heart. For further information please visit [email or web address] Please follow instruction sheet, as given.   Follow-Up: Per Structural Heart Valve Team1}    Other Instructions You have been referred to Cardiothoracic Surgery,  Dr. Clent Jacks     [facility] CARDIOVASCULAR DIVISION [facility] ST OFFICE [address] [place], [address] [place] [address] Dept: [phone] Loc: [phone]  Wanda Ortiz  05/10/2021  You are scheduled for a Cardiac Catheterization on Friday, November 18 with Dr. Alverda Skeans.  1. Please arrive at the [facility] (Main Entrance A) at [facility]: [address] [place], [place] [address] at 7:00 AM (This time is two hours before your procedure to ensure your preparation). Free valet parking service is available.   Special note: Every effort is made to have your procedure done on time. Please understand that emergencies sometimes delay scheduled procedures.  2. Diet: Do not eat solid foods after midnight.  The patient may have clear liquids until 5am upon the day of the procedure.  3. Labs: You will need to have blood drawn today. You do not need to  be fasting.  4. Medication instructions in preparation for your procedure:   Contrast Allergy: No  On the morning of your procedure, take your Aspirin 81 mg and any morning medicines NOT listed above.  You may use sips of water.  5. Plan for one night stay--bring personal belongings. 6. Bring a current list of your medications and current insurance cards. 7. You MUST have a responsible person to drive you home. 8. Someone MUST be with you the first 24 hours after you arrive home or your discharge will be delayed. 9. Please wear clothes that are easy to get on and off and wear slip-on shoes.  Thank you for allowing [place] to care for you!   -- [facility]

## 2021-05-11 ENCOUNTER — Other Ambulatory Visit: Payer: Self-pay | Admitting: Internal Medicine

## 2021-05-11 ENCOUNTER — Encounter: Payer: Self-pay | Admitting: Internal Medicine

## 2021-05-11 DIAGNOSIS — I340 Nonrheumatic mitral (valve) insufficiency: Secondary | ICD-10-CM

## 2021-05-11 LAB — CBC
Hematocrit: 43.5 % (ref 34.0–46.6)
Hemoglobin: 14.7 g/dL (ref 11.1–15.9)
MCH: 29.2 pg (ref 26.6–33.0)
MCHC: 33.8 g/dL (ref 31.5–35.7)
MCV: 87 fL (ref 79–97)
Platelets: 212 10*3/uL (ref 150–450)
RBC: 5.03 x10E6/uL (ref 3.77–5.28)
RDW: 13.7 % (ref 11.7–15.4)
WBC: 9.5 10*3/uL (ref 3.4–10.8)

## 2021-05-11 LAB — BASIC METABOLIC PANEL
BUN/Creatinine Ratio: 15 (ref 12–28)
BUN: 13 mg/dL (ref 8–27)
CO2: 28 mmol/L (ref 20–29)
Calcium: 9.1 mg/dL (ref 8.7–10.3)
Chloride: 108 mmol/L — ABNORMAL HIGH (ref 96–106)
Creatinine, Ser: 0.88 mg/dL (ref 0.57–1.00)
Glucose: 86 mg/dL (ref 70–99)
Potassium: 3.4 mmol/L — ABNORMAL LOW (ref 3.5–5.2)
Sodium: 149 mmol/L — ABNORMAL HIGH (ref 134–144)
eGFR: 75 mL/min/{1.73_m2} (ref >59)

## 2021-05-11 MED ORDER — SODIUM CHLORIDE 0.9% FLUSH: 3.0000 mL | Freq: Two times a day (BID) | INTRAVENOUS | Status: AC

## 2021-05-16 ENCOUNTER — Ambulatory Visit: Payer: Medicaid Other | Admitting: Cardiology

## 2021-05-17 ENCOUNTER — Telehealth: Payer: Self-pay | Admitting: *Deleted

## 2021-05-17 NOTE — Telephone Encounter (Addendum)
Cardiac catheterization scheduled at [facility] for: Friday May 18, 2021 9 AM Arrive [facility] Main Entrance A [facility]) at: 7 AM   No solid food after midnight prior to cath, clear liquids until 5 AM day of procedure.  Medication instructions: Hold: HCTZ-AM of procedure  Except hold medications usual morning medications can be taken pre-cath with sips of water including aspirin 81 mg.    Confirmed patient has responsible adult to drive home post procedure and be with patient first 24 hours after arriving home.  [facility] does allow one visitor to accompany you and wait in the hospital waiting room while you are there for your procedure. You and your visitor will be asked to wear a mask once you enter the hospital.   Patient reports does not currently have any new symptoms concerning for COVID-19 and no household members with COVID-19 like illness.    Reviewed procedure/mask/visitor instructions with patient.

## 2021-05-18 ENCOUNTER — Ambulatory Visit (HOSPITAL_COMMUNITY)
Admission: RE | Admit: 2021-05-18 | Discharge: 2021-05-18 | Disposition: A | Discharge status: Home / Self Care | Payer: Medicaid Other | Attending: Internal Medicine | Admitting: Internal Medicine

## 2021-05-18 ENCOUNTER — Encounter (HOSPITAL_COMMUNITY): Payer: Self-pay | Admitting: Internal Medicine

## 2021-05-18 ENCOUNTER — Encounter (HOSPITAL_COMMUNITY)
Admission: RE | Disposition: A | Discharge status: Home / Self Care | Payer: Self-pay | Source: Home / Self Care | Attending: Internal Medicine

## 2021-05-18 ENCOUNTER — Other Ambulatory Visit: Payer: Self-pay

## 2021-05-18 DIAGNOSIS — M069 Rheumatoid arthritis, unspecified: Secondary | ICD-10-CM | POA: Insufficient documentation

## 2021-05-18 DIAGNOSIS — I10 Essential (primary) hypertension: Secondary | ICD-10-CM | POA: Insufficient documentation

## 2021-05-18 DIAGNOSIS — E785 Hyperlipidemia, unspecified: Secondary | ICD-10-CM | POA: Insufficient documentation

## 2021-05-18 DIAGNOSIS — I340 Nonrheumatic mitral (valve) insufficiency: Secondary | ICD-10-CM | POA: Diagnosis not present

## 2021-05-18 HISTORY — PX: RIGHT/LEFT HEART CATH AND CORONARY ANGIOGRAPHY: CATH118266

## 2021-05-18 LAB — POCT I-STAT 7, (LYTES, BLD GAS, ICA,H+H)
Acid-Base Excess: 3 mmol/L — ABNORMAL HIGH (ref 0.0–2.0)
Bicarbonate: 28.2 mmol/L — ABNORMAL HIGH (ref 20.0–28.0)
Calcium, Ion: 1.18 mmol/L (ref 1.15–1.40)
HCT: 40 % (ref 36.0–46.0)
Hemoglobin: 13.6 g/dL (ref 12.0–15.0)
O2 Saturation: 95 %
Potassium: 3.6 mmol/L (ref 3.5–5.1)
Sodium: 148 mmol/L — ABNORMAL HIGH (ref 135–145)
TCO2: 30 mmol/L (ref 22–32)
pCO2 arterial: 45.7 mmHg (ref 32.0–48.0)
pH, Arterial: 7.399 (ref 7.350–7.450)
pO2, Arterial: 74 mmHg — ABNORMAL LOW (ref 83.0–108.0)

## 2021-05-18 LAB — POCT I-STAT EG7
Acid-Base Excess: 2 mmol/L (ref 0.0–2.0)
Acid-Base Excess: 3 mmol/L — ABNORMAL HIGH (ref 0.0–2.0)
Bicarbonate: 27.8 mmol/L (ref 20.0–28.0)
Bicarbonate: 28.6 mmol/L — ABNORMAL HIGH (ref 20.0–28.0)
Calcium, Ion: 1.07 mmol/L — ABNORMAL LOW (ref 1.15–1.40)
Calcium, Ion: 1.16 mmol/L (ref 1.15–1.40)
HCT: 39 % (ref 36.0–46.0)
HCT: 39 % (ref 36.0–46.0)
Hemoglobin: 13.3 g/dL (ref 12.0–15.0)
Hemoglobin: 13.3 g/dL (ref 12.0–15.0)
O2 Saturation: 69 %
O2 Saturation: 76 %
Potassium: 3.4 mmol/L — ABNORMAL LOW (ref 3.5–5.1)
Potassium: 3.6 mmol/L (ref 3.5–5.1)
Sodium: 148 mmol/L — ABNORMAL HIGH (ref 135–145)
Sodium: 149 mmol/L — ABNORMAL HIGH (ref 135–145)
TCO2: 29 mmol/L (ref 22–32)
TCO2: 30 mmol/L (ref 22–32)
pCO2, Ven: 47.8 mmHg (ref 44.0–60.0)
pCO2, Ven: 48.5 mmHg (ref 44.0–60.0)
pH, Ven: 7.373 (ref 7.250–7.430)
pH, Ven: 7.379 (ref 7.250–7.430)
pO2, Ven: 37 mmHg (ref 32.0–45.0)
pO2, Ven: 42 mmHg (ref 32.0–45.0)

## 2021-05-18 LAB — BASIC METABOLIC PANEL
Anion gap: 5 (ref 5–15)
BUN: 13 mg/dL (ref 6–20)
CO2: 29 mmol/L (ref 22–32)
Calcium: 8.4 mg/dL — ABNORMAL LOW (ref 8.9–10.3)
Chloride: 110 mmol/L (ref 98–111)
Creatinine, Ser: 0.91 mg/dL (ref 0.44–1.00)
GFR, Estimated: >60 mL/min (ref >60)
Glucose, Bld: 103 mg/dL — ABNORMAL HIGH (ref 70–99)
Potassium: 4.9 mmol/L (ref 3.5–5.1)
Sodium: 144 mmol/L (ref 135–145)

## 2021-05-18 SURGERY — RIGHT/LEFT HEART CATH AND CORONARY ANGIOGRAPHY
Anesthesia: LOCAL

## 2021-05-18 MED ORDER — SODIUM CHLORIDE 0.9 % IV SOLN: 250.0000 mL | INTRAVENOUS | Status: DC | PRN

## 2021-05-18 MED ORDER — SODIUM CHLORIDE 0.9% FLUSH: 3.0000 mL | Freq: Two times a day (BID) | INTRAVENOUS | Status: DC

## 2021-05-18 MED ORDER — ONDANSETRON HCL 4 MG/2ML IJ SOLN: 4.0000 mg | Freq: Four times a day (QID) | INTRAMUSCULAR | Status: DC | PRN

## 2021-05-18 MED ORDER — FENTANYL CITRATE (PF) 100 MCG/2ML IJ SOLN
INTRAMUSCULAR | Status: DC | PRN
  Administered 2021-05-18: 25 ug via INTRAVENOUS

## 2021-05-18 MED ORDER — SODIUM CHLORIDE 0.9% FLUSH: 3.0000 mL | INTRAVENOUS | Status: DC | PRN

## 2021-05-18 MED ORDER — ASPIRIN 81 MG PO CHEW: 81.0000 mg | CHEWABLE_TABLET | ORAL | Status: DC

## 2021-05-18 MED ORDER — VERAPAMIL HCL 2.5 MG/ML IV SOLN
INTRAVENOUS | Status: AC
  Filled 2021-05-18: qty 2

## 2021-05-18 MED ORDER — HEPARIN (PORCINE) IN NACL 1000-0.9 UT/500ML-% IV SOLN
INTRAVENOUS | Status: AC
  Filled 2021-05-18: qty 1000

## 2021-05-18 MED ORDER — MIDAZOLAM HCL 2 MG/2ML IJ SOLN
INTRAMUSCULAR | Status: DC | PRN
  Administered 2021-05-18: 1 mg via INTRAVENOUS

## 2021-05-18 MED ORDER — LIDOCAINE HCL (PF) 1 % IJ SOLN
INTRAMUSCULAR | Status: DC | PRN
  Administered 2021-05-18: 2 mL

## 2021-05-18 MED ORDER — ACETAMINOPHEN 325 MG PO TABS: 650.0000 mg | ORAL_TABLET | ORAL | Status: DC | PRN

## 2021-05-18 MED ORDER — IOHEXOL 350 MG/ML SOLN
INTRAVENOUS | Status: DC | PRN
  Administered 2021-05-18: 125 mL

## 2021-05-18 MED ORDER — VERAPAMIL HCL 2.5 MG/ML IV SOLN: INTRAVENOUS | Status: DC | PRN

## 2021-05-18 MED ORDER — HEPARIN (PORCINE) IN NACL 1000-0.9 UT/500ML-% IV SOLN
INTRAVENOUS | Status: DC | PRN
  Administered 2021-05-18 (×2): 500 mL

## 2021-05-18 MED ORDER — HEPARIN SODIUM (PORCINE) 1000 UNIT/ML IJ SOLN
INTRAMUSCULAR | Status: AC
  Filled 2021-05-18: qty 1

## 2021-05-18 MED ORDER — HYDRALAZINE HCL 20 MG/ML IJ SOLN: 10.0000 mg | INTRAMUSCULAR | Status: DC | PRN

## 2021-05-18 MED ORDER — FENTANYL CITRATE (PF) 100 MCG/2ML IJ SOLN
INTRAMUSCULAR | Status: AC
  Filled 2021-05-18: qty 2

## 2021-05-18 MED ORDER — LIDOCAINE HCL (PF) 1 % IJ SOLN
INTRAMUSCULAR | Status: AC
  Filled 2021-05-18: qty 30

## 2021-05-18 MED ORDER — LABETALOL HCL 5 MG/ML IV SOLN: 10.0000 mg | INTRAVENOUS | Status: DC | PRN

## 2021-05-18 MED ORDER — SODIUM CHLORIDE 0.9 % IV SOLN: INTRAVENOUS | Status: DC

## 2021-05-18 MED ORDER — MIDAZOLAM HCL 2 MG/2ML IJ SOLN
INTRAMUSCULAR | Status: AC
  Filled 2021-05-18: qty 2

## 2021-05-18 MED ORDER — HEPARIN SODIUM (PORCINE) 1000 UNIT/ML IJ SOLN
INTRAMUSCULAR | Status: DC | PRN
  Administered 2021-05-18: 5000 [IU] via INTRAVENOUS

## 2021-05-18 SURGICAL SUPPLY — 15 items
CATH BALLN WEDGE 5F 110CM (CATHETERS) ×1 IMPLANT
CATH DIAG 6FR JR4 (CATHETERS) ×1 IMPLANT
CATH DIAG 6FR PIGTAIL ANGLED (CATHETERS) ×1 IMPLANT
CATH INFINITI 6F FL3.5 (CATHETERS) ×1 IMPLANT
DEVICE RAD COMP TR BAND LRG (VASCULAR PRODUCTS) ×1 IMPLANT
GLIDESHEATH SLEND SS 6F .021 (SHEATH) ×2 IMPLANT
KIT HEART LEFT (KITS) ×3 IMPLANT
PACK CARDIAC CATHETERIZATION (CUSTOM PROCEDURE TRAY) ×3 IMPLANT
SHEATH GLIDE SLENDER 4/5FR (SHEATH) IMPLANT
STOPCOCK MORSE 400PSI 3WAY (MISCELLANEOUS) ×1 IMPLANT
SYR MEDRAD MARK 7 150ML (SYRINGE) ×3 IMPLANT
TRANSDUCER W/STOPCOCK (MISCELLANEOUS) ×3 IMPLANT
TUBING CIL FLEX 10 FLL-RA (TUBING) ×3 IMPLANT
TUBING CONTRAST HIGH PRESS 48 (TUBING) ×1 IMPLANT
WIRE EMERALD 3MM-J .035X260CM (WIRE) ×1 IMPLANT

## 2021-05-18 NOTE — Interval H&P Note (Signed)
History and Physical Interval Note:  05/18/2021 7:47 AM  Wanda Ortiz  has presented today for surgery, with the diagnosis of mitral valve disease.  The various methods of treatment have been discussed with the patient and family. After consideration of risks, benefits and other options for treatment, the patient has consented to  Procedure(s): RIGHT/LEFT HEART CATH AND CORONARY ANGIOGRAPHY (N/A) as a surgical intervention.  The patient's history has been reviewed, patient examined, no change in status, stable for surgery.  I have reviewed the patient's chart and labs.  Questions were answered to the patient's satisfaction.    Cath Lab Visit (complete for each Cath Lab visit)  Clinical Evaluation Leading to the Procedure:   ACS: No.  Non-ACS:    Anginal Classification: No Symptoms  Anti-ischemic medical therapy: No Therapy  Non-Invasive Test Results: No non-invasive testing performed  Prior CABG: No previous CABG        [name]

## 2021-06-06 ENCOUNTER — Encounter (HOSPITAL_COMMUNITY): Payer: Self-pay | Admitting: Internal Medicine

## 2021-06-27 ENCOUNTER — Other Ambulatory Visit: Payer: Self-pay | Admitting: Cardiology

## 2021-06-27 ENCOUNTER — Other Ambulatory Visit: Payer: Self-pay | Admitting: Family Medicine

## 2021-06-28 MED ORDER — ATORVASTATIN CALCIUM 40 MG PO TABS: 40.0000 mg | ORAL_TABLET | Freq: Every day | ORAL | 0 refills | Status: AC

## 2021-06-28 NOTE — Telephone Encounter (Signed)
Please schedule overdue F/U appointment. Thank you! ?

## 2021-06-29 ENCOUNTER — Other Ambulatory Visit: Payer: Self-pay

## 2021-06-29 ENCOUNTER — Encounter: Payer: Self-pay | Admitting: Family Medicine

## 2021-06-29 ENCOUNTER — Ambulatory Visit (INDEPENDENT_AMBULATORY_CARE_PROVIDER_SITE_OTHER): Payer: Self-pay | Admitting: Family Medicine

## 2021-06-29 VITALS — BP 145/86 | HR 85 | Wt 196.0 lb

## 2021-06-29 DIAGNOSIS — R051 Acute cough: Secondary | ICD-10-CM

## 2021-06-29 MED ORDER — ALBUTEROL SULFATE HFA 108 (90 BASE) MCG/ACT IN AERS: 2.0000 | INHALATION_SPRAY | Freq: Four times a day (QID) | RESPIRATORY_TRACT | 0 refills | Status: AC | PRN

## 2021-06-29 MED ORDER — HYDROCOD POLST-CPM POLST ER 10-8 MG/5ML PO SUER: 5.0000 mL | Freq: Two times a day (BID) | ORAL | 0 refills | Status: AC | PRN

## 2021-06-29 MED ORDER — BENZONATATE 200 MG PO CAPS
200.0000 mg | ORAL_CAPSULE | Freq: Two times a day (BID) | ORAL | 0 refills | Status: AC | PRN
Start: 2021-06-29 — End: ?

## 2021-06-29 NOTE — Patient Instructions (Addendum)
Zyrtec, Claritin etc Cough medicine sent in  Albuterol refilled

## 2021-06-29 NOTE — Progress Notes (Signed)
Acute Office Visit  Subjective:    Patient ID: Wanda Ortiz, female    DOB: 1961/04/07, 60 y.o.   MRN: [id number]  Chief Complaint  Patient presents with   Cough    HPI Patient is in today for cough.  Patient reports she developed a productive cough 3-4 days ago with clear sputum. She has had some occasional sneezing, but no other URI symptoms. She denies any chest pain, shortness of breath, wheezing, fevers, GI/GU symptoms, body aches, fatigue. The cough is keeping her from sleeping.       Past Medical History:  Diagnosis Date   AR (allergic rhinitis)    Hyperlipemia    Hypertension    Migraines     Past Surgical History:  Procedure Laterality Date   ABDOMINAL HYSTERECTOMY     partial with BSO for [facility]   BUBBLE STUDY  04/27/2021   Procedure: BUBBLE STUDY;  Surgeon: Elouise Munroe, MD;  Location: [facility] ENDOSCOPY;  Service: Cardiology;;   RIGHT/LEFT HEART CATH AND CORONARY ANGIOGRAPHY N/A 05/18/2021   Procedure: RIGHT/LEFT HEART CATH AND CORONARY ANGIOGRAPHY;  Surgeon: Early Osmond, MD;  Location: [facility];  Service: Cardiovascular;  Laterality: N/A;   TEE WITHOUT CARDIOVERSION N/A 04/27/2021   Procedure: TRANSESOPHAGEAL ECHOCARDIOGRAM (TEE);  Surgeon: Elouise Munroe, MD;  Location: [facility];  Service: Cardiology;  Laterality: N/A;    Family History  Problem Relation Age of Onset   Heart disease Mother    Diabetes Mother    Hyperlipidemia Mother    Hypertension Father    Cancer Father        prostate    Social History   Socioeconomic History   Marital status: Single    Spouse name: Not on file   Number of children: Not on file   Years of education: Not on file   Highest education level: Not on file  Occupational History   Not on file  Tobacco Use   Smoking status: Every Day    Packs/day: 0.25    Years: 15.00    Pack years: 3.75    Types: Cigarettes    Last attempt to quit: 11/30/2018    Years since quitting: 2.5    Smokeless tobacco: Never  Vaping Use   Vaping Use: Never used  Substance and Sexual Activity   Alcohol use: Yes    Alcohol/week: 2.0 standard drinks    Types: 2 Standard drinks or equivalent per week   Drug use: No   Sexual activity: Yes  Other Topics Concern   Not on file  Social History Narrative   Not on file   Social Determinants of Health   Financial Resource Strain: Not on file  Food Insecurity: Not on file  Transportation Needs: Not on file  Physical Activity: Not on file  Stress: Not on file  Social Connections: Not on file  Intimate Partner Violence: Not on file    Outpatient Medications Prior to Visit  Medication Sig Dispense Refill   atorvastatin (LIPITOR) 40 MG tablet Take 1 tablet (40 mg total) by mouth daily. PLEASE SCHEDULE OFFICE VISIT FOR FURTHER VISIT. THANK YOU! 30 tablet 0   buPROPion (WELLBUTRIN XL) 150 MG 24 hr tablet Take 1 tablet (150 mg total) by mouth daily. 30 tablet 1   folic acid (FOLVITE) 1 MG tablet Take 3 mg by mouth daily.     methotrexate (RHEUMATREX) 2.5 MG tablet Take 10 mg by mouth every Thursday. 4 tabs  hydrochlorothiazide (HYDRODIURIL) 25 MG tablet Take 1 tablet (25 mg total) by mouth daily. 90 tablet 1   naratriptan (AMERGE) 2.5 MG tablet Take 1 tablet (2.5 mg total) by mouth as needed. Take one (1) tablet at onset of headache; if returns or does not resolve, may repeat after 4 hours; do not exceed five (5) mg in 24 hours. (Patient not taking: Reported on 06/29/2021) 10 tablet PRN   albuterol (VENTOLIN HFA) 108 (90 Base) MCG/ACT inhaler Inhale 2 puffs into the lungs every 6 (six) hours as needed for wheezing or shortness of breath. 180 g 0   cetirizine-pseudoephedrine (ZYRTEC-D) 5-120 MG tablet Take 1 tablet by mouth daily as needed. 30 tablet 0   Facility-Administered Medications Prior to Visit  Medication Dose Route Frequency Provider Last Rate Last Admin   sodium chloride flush (NS) 0.9 % injection 3 mL  3 mL Intravenous Q12H  Early Osmond, MD        Allergies  Allergen Reactions   Fish Allergy Hives   Shellfish Allergy Hives    Review of Systems All review of systems negative except what is listed in the HPI     Objective:    Physical Exam Vitals reviewed.  Constitutional:      Appearance: Normal appearance.  HENT:     Head: Normocephalic and atraumatic.  Cardiovascular:     Rate and Rhythm: Normal rate and regular rhythm.     Heart sounds: Murmur heard.  Pulmonary:     Effort: Pulmonary effort is normal.     Breath sounds: Normal breath sounds. No wheezing, rhonchi or rales.  Skin:    General: Skin is warm and dry.  Neurological:     General: No focal deficit present.     Mental Status: She is alert and oriented to person, place, and time.  Psychiatric:        Mood and Affect: Mood normal.        Behavior: Behavior normal.        Thought Content: Thought content normal.        Judgment: Judgment normal.    BP (!) 145/86    Pulse 85    Wt 196 lb (88.9 kg)    SpO2 97%    BMI 31.64 kg/m  Wt Readings from Last 3 Encounters:  06/29/21 196 lb (88.9 kg)  05/18/21 196 lb (88.9 kg)  05/10/21 196 lb (88.9 kg)    Health Maintenance Due  Topic Date Due   Pneumococcal Vaccine 16-80 Years old (1 - PCV) Never done   Zoster Vaccines- Shingrix (1 of 2) Never done   MAMMOGRAM  03/29/2018   COVID-19 Vaccine (3 - Moderna risk series) 12/01/2019   INFLUENZA VACCINE  01/29/2021    There are no preventive care reminders to display for this patient.   Lab Results  Component Value Date   TSH 0.28 (L) 03/19/2016   Lab Results  Component Value Date   WBC 9.5 05/10/2021   HGB 13.3 05/18/2021   HCT 39.0 05/18/2021   MCV 87 05/10/2021   PLT 212 05/10/2021   Lab Results  Component Value Date   NA 149 (H) 05/18/2021   K 3.4 (L) 05/18/2021   CO2 29 05/18/2021   GLUCOSE 103 (H) 05/18/2021   BUN 13 05/18/2021   CREATININE 0.91 05/18/2021   BILITOT 0.4 08/04/2019   ALKPHOS 119 04/14/2018    AST 21 08/04/2019   ALT 19 08/04/2019   PROT 6.9 08/04/2019   ALBUMIN 4.0  03/19/2016   CALCIUM 8.4 (L) 05/18/2021   ANIONGAP 5 05/18/2021   EGFR 75 05/10/2021   Lab Results  Component Value Date   CHOL 243 (H) 03/05/2021   Lab Results  Component Value Date   HDL 41 03/05/2021   Lab Results  Component Value Date   LDLCALC 130 (H) 03/05/2021   Lab Results  Component Value Date   TRIG 362 (H) 03/05/2021   Lab Results  Component Value Date   CHOLHDL 5.9 03/05/2021   No results found for: HGBA1C     Assessment & Plan:    1. Acute cough Likely viral cough. Adding tessalon and cough syrup, but discussed pulmonary hygiene. Refilling albuterol for as needed use. Continue supportive measures including rest, hydration, humidifier use, steam showers, warm compresses to sinuses, warm liquids with lemon and honey, and over-the-counter cough, cold, and analgesics as needed.  Patient aware of signs/symptoms requiring further/urgent evaluation.   - benzonatate (TESSALON) 200 MG capsule; Take 1 capsule (200 mg total) by mouth 2 (two) times daily as needed for cough.  Dispense: 20 capsule; Refill: 0 - albuterol (VENTOLIN HFA) 108 (90 Base) MCG/ACT inhaler; Inhale 2 puffs into the lungs every 6 (six) hours as needed for wheezing or shortness of breath.  Dispense: 180 g; Refill: 0 - chlorpheniramine-HYDROcodone (TUSSIONEX) 10-8 MG/5ML SUER; Take 5 mLs by mouth every 12 (twelve) hours as needed for up to 5 days for cough (cough, will cause drowsiness.).  Dispense: 50 mL; Refill: 0   Follow-up if symptoms worsen or fail to improve.   Terrilyn Saver, NP

## 2021-06-29 NOTE — Telephone Encounter (Signed)
LMOV  

## 2021-06-29 NOTE — Progress Notes (Signed)
She reports cough x 2-3 days. Denies any f/s/c. Mucus is clear, taking nyquil cough and flu.   Negative covid test on saturday

## 2021-08-01 ENCOUNTER — Other Ambulatory Visit: Payer: Self-pay | Admitting: *Deleted

## 2021-08-01 DIAGNOSIS — Z9889 Other specified postprocedural states: Secondary | ICD-10-CM

## 2021-08-29 DEATH — deceased

## 2021-10-23 IMAGING — DX DG FOOT COMPLETE 3+V*L*
3 series · 3 of 3 positions shown · non-contrast
Comparison: 07/24/2010

CLINICAL DATA: Distal left foot pain between the first and second
metatarsal head

EXAM:
LEFT FOOT - COMPLETE 3+ VIEW

[foot ap]
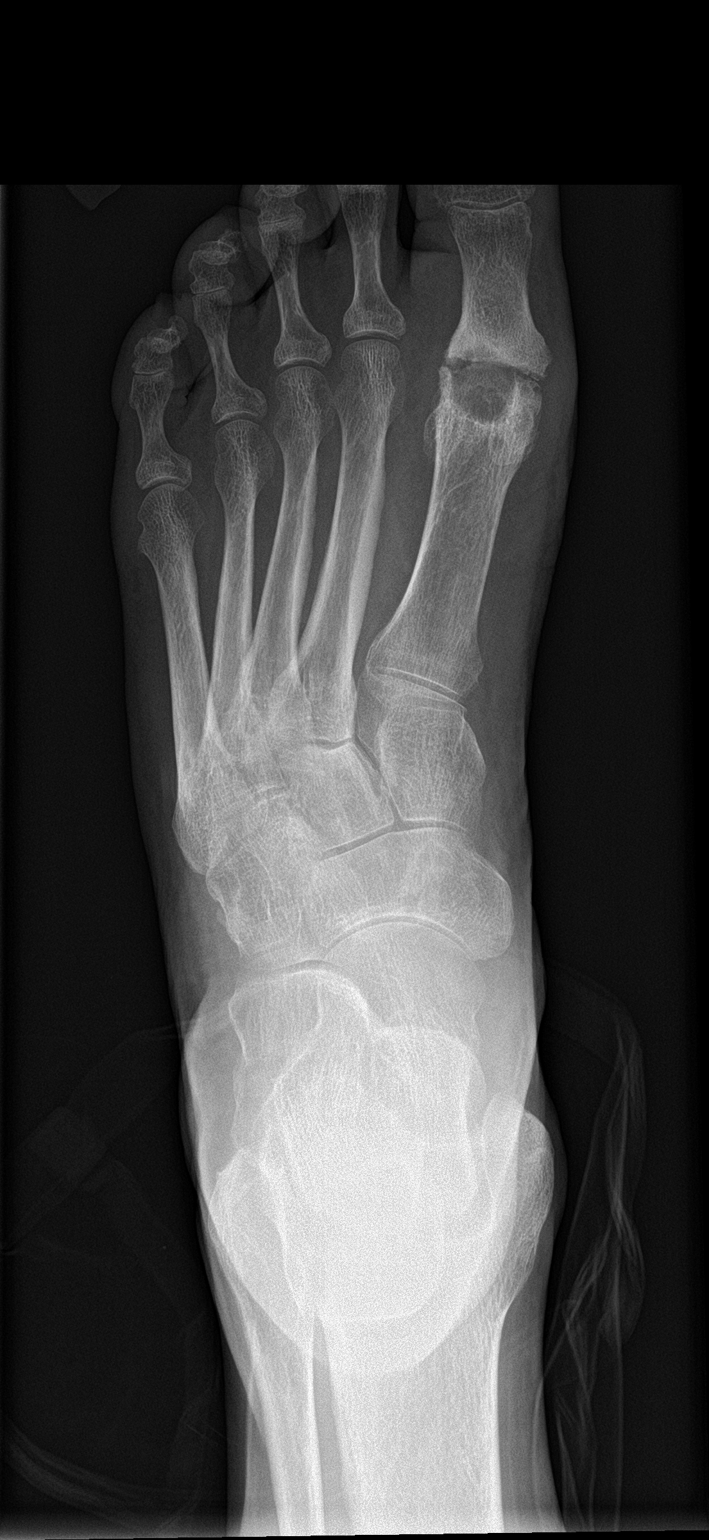

[foot obl]
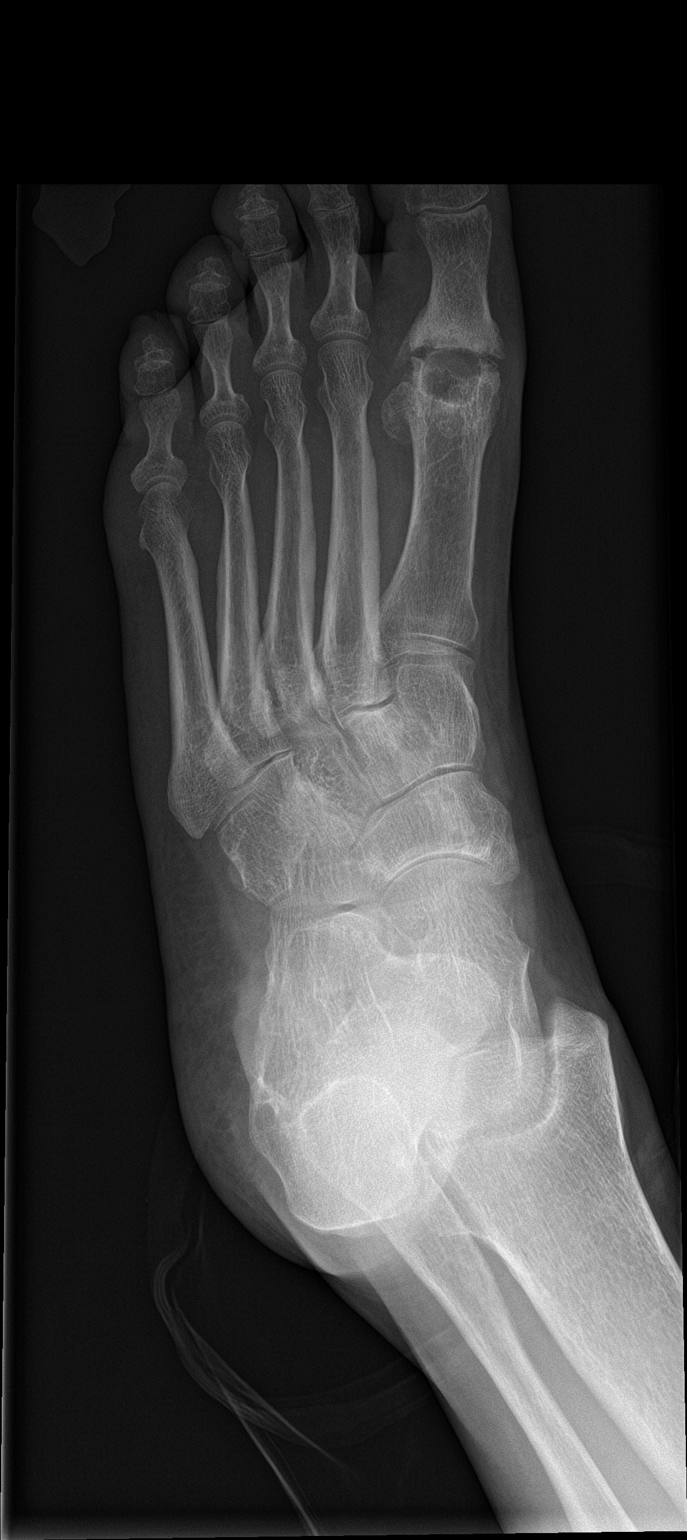

[foot lat]
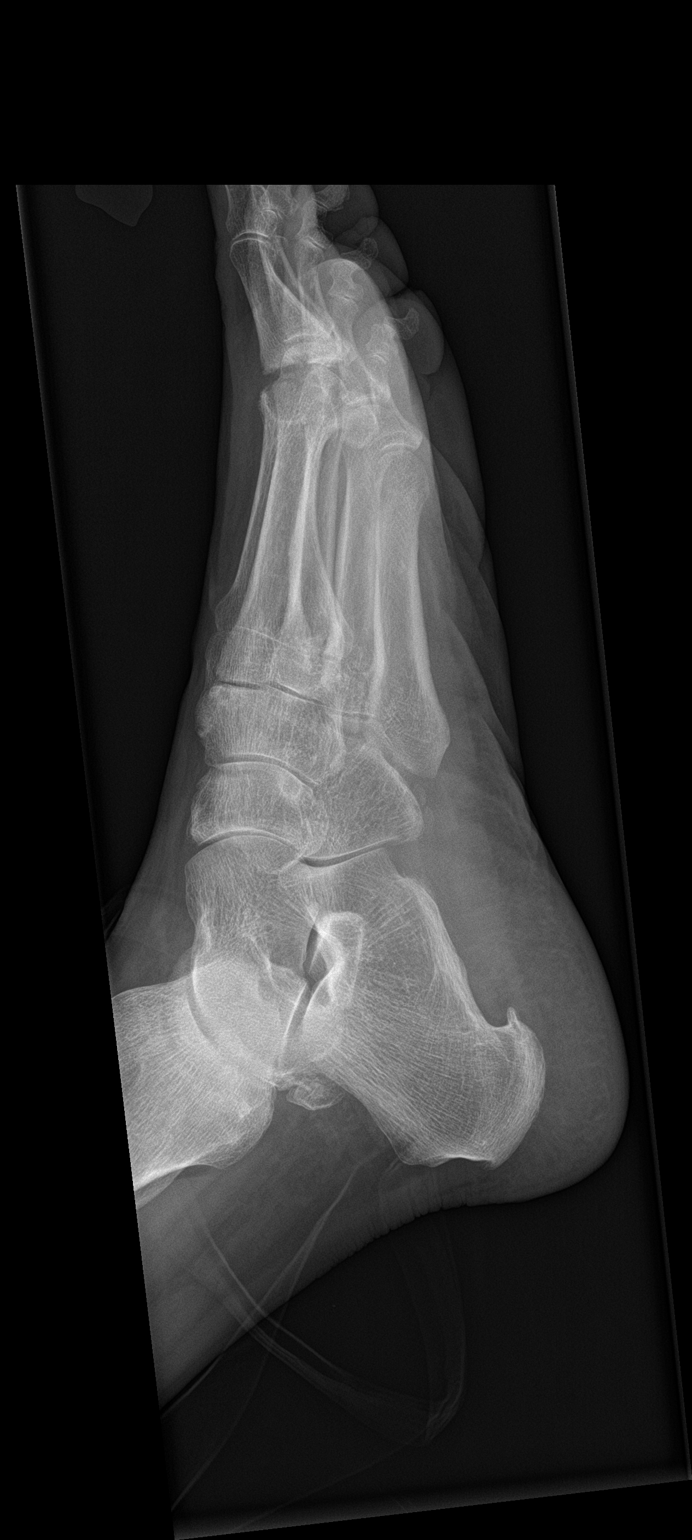

[3 of 3 positions shown; findings below may reference images not displayed]

FINDINGS: No acute fracture or dislocation. Interval postsurgical changes at
the first metatarsal head with either radiolucent implant or
hardware removal. Progressive degenerative changes at the first MTP
joint. No cortical thickening to suggest stress reaction. Soft
tissues are unremarkable.
IMPRESSION: 1. No acute osseous abnormality of the left foot.
2. Interval postsurgical changes at the first MTP joint. Progressive
osteoarthritis of the first MTP joint.
